# Patient Record
Sex: Female | Born: 1955 | Race: Black or African American | Hispanic: No | Smoking: Never smoker
Health system: Southern US, Community
[De-identification: ages and names within clinical notes are randomized; demographics above are authoritative.]

## PROBLEM LIST (undated history)

## (undated) DIAGNOSIS — E119 Type 2 diabetes mellitus without complications: Secondary | ICD-10-CM

## (undated) DIAGNOSIS — E079 Disorder of thyroid, unspecified: Secondary | ICD-10-CM

## (undated) DIAGNOSIS — E0789 Other specified disorders of thyroid: Secondary | ICD-10-CM

## (undated) DIAGNOSIS — K219 Gastro-esophageal reflux disease without esophagitis: Secondary | ICD-10-CM

## (undated) HISTORY — DX: Disorder of thyroid, unspecified: E07.9

## (undated) HISTORY — PX: ABDOMINAL HYSTERECTOMY: SHX81

## (undated) HISTORY — PX: KELOID EXCISION: SHX1856

## (undated) HISTORY — PX: COLONOSCOPY: SHX174

---
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
## 2000-08-21 ENCOUNTER — Other Ambulatory Visit: Admission: RE | Admit: 2000-08-21 | Discharge: 2000-08-21 | Payer: Self-pay | Admitting: Family Medicine

## 2000-08-28 ENCOUNTER — Ambulatory Visit (HOSPITAL_COMMUNITY): Admission: RE | Admit: 2000-08-28 | Discharge: 2000-08-28 | Payer: Self-pay | Admitting: Family Medicine

## 2000-09-14 ENCOUNTER — Encounter: Admission: RE | Admit: 2000-09-14 | Discharge: 2000-09-14 | Payer: Self-pay | Admitting: Family Medicine

## 2000-09-14 ENCOUNTER — Encounter: Payer: Self-pay | Admitting: Family Medicine

## 2000-11-25 ENCOUNTER — Emergency Department (HOSPITAL_COMMUNITY): Admission: EM | Admit: 2000-11-25 | Discharge: 2000-11-25 | Payer: Self-pay | Admitting: Emergency Medicine

## 2002-05-20 ENCOUNTER — Encounter: Admission: RE | Admit: 2002-05-20 | Discharge: 2002-05-20 | Payer: Self-pay | Admitting: Family Medicine

## 2002-05-20 ENCOUNTER — Encounter: Payer: Self-pay | Admitting: Family Medicine

## 2004-08-12 ENCOUNTER — Encounter: Admission: RE | Admit: 2004-08-12 | Discharge: 2004-08-12 | Payer: Self-pay | Admitting: Occupational Medicine

## 2005-02-14 ENCOUNTER — Encounter: Admission: RE | Admit: 2005-02-14 | Discharge: 2005-05-15 | Payer: Self-pay | Admitting: Occupational Medicine

## 2008-11-12 ENCOUNTER — Encounter: Admission: RE | Admit: 2008-11-12 | Discharge: 2008-11-12 | Payer: Self-pay | Admitting: Internal Medicine

## 2012-05-29 ENCOUNTER — Other Ambulatory Visit: Payer: Self-pay | Admitting: Internal Medicine

## 2012-05-29 DIAGNOSIS — Z1231 Encounter for screening mammogram for malignant neoplasm of breast: Secondary | ICD-10-CM

## 2012-06-10 ENCOUNTER — Ambulatory Visit
Admission: RE | Admit: 2012-06-10 | Discharge: 2012-06-10 | Disposition: A | Discharge status: Home / Self Care | Payer: BC Managed Care – PPO | Source: Ambulatory Visit | Attending: Internal Medicine | Admitting: Internal Medicine

## 2012-06-10 DIAGNOSIS — Z1231 Encounter for screening mammogram for malignant neoplasm of breast: Secondary | ICD-10-CM

## 2013-07-11 ENCOUNTER — Other Ambulatory Visit: Payer: Self-pay

## 2013-07-11 DIAGNOSIS — N644 Mastodynia: Secondary | ICD-10-CM

## 2013-07-21 ENCOUNTER — Ambulatory Visit
Admission: RE | Admit: 2013-07-21 | Discharge: 2013-07-21 | Disposition: A | Discharge status: Home / Self Care | Payer: BC Managed Care – PPO | Source: Ambulatory Visit

## 2013-07-21 ENCOUNTER — Other Ambulatory Visit: Payer: BC Managed Care – PPO

## 2013-07-21 DIAGNOSIS — N644 Mastodynia: Secondary | ICD-10-CM

## 2014-08-26 ENCOUNTER — Other Ambulatory Visit: Payer: Self-pay

## 2014-08-26 DIAGNOSIS — Z1231 Encounter for screening mammogram for malignant neoplasm of breast: Secondary | ICD-10-CM

## 2014-09-11 ENCOUNTER — Other Ambulatory Visit: Payer: Self-pay

## 2014-09-11 ENCOUNTER — Ambulatory Visit: Payer: Self-pay

## 2014-09-11 ENCOUNTER — Ambulatory Visit
Admission: RE | Admit: 2014-09-11 | Discharge: 2014-09-11 | Disposition: A | Discharge status: Home / Self Care | Payer: 59 | Source: Ambulatory Visit

## 2014-09-11 DIAGNOSIS — Z1231 Encounter for screening mammogram for malignant neoplasm of breast: Secondary | ICD-10-CM

## 2015-11-29 ENCOUNTER — Other Ambulatory Visit: Payer: Self-pay | Admitting: Nurse Practitioner

## 2015-11-29 DIAGNOSIS — Z1231 Encounter for screening mammogram for malignant neoplasm of breast: Secondary | ICD-10-CM

## 2015-12-15 ENCOUNTER — Ambulatory Visit
Admission: RE | Admit: 2015-12-15 | Discharge: 2015-12-15 | Disposition: A | Discharge status: Home / Self Care | Payer: 59 | Source: Ambulatory Visit | Attending: Internal Medicine | Admitting: Internal Medicine

## 2015-12-15 DIAGNOSIS — Z1231 Encounter for screening mammogram for malignant neoplasm of breast: Secondary | ICD-10-CM

## 2017-01-29 ENCOUNTER — Other Ambulatory Visit: Payer: Self-pay | Admitting: Nurse Practitioner

## 2017-01-29 DIAGNOSIS — R229 Localized swelling, mass and lump, unspecified: Secondary | ICD-10-CM

## 2017-02-02 ENCOUNTER — Other Ambulatory Visit: Payer: Self-pay | Admitting: Nurse Practitioner

## 2017-02-02 DIAGNOSIS — Z1231 Encounter for screening mammogram for malignant neoplasm of breast: Secondary | ICD-10-CM

## 2017-02-12 ENCOUNTER — Ambulatory Visit
Admission: RE | Admit: 2017-02-12 | Discharge: 2017-02-12 | Disposition: A | Discharge status: Home / Self Care | Payer: No Typology Code available for payment source | Source: Ambulatory Visit | Attending: Nurse Practitioner | Admitting: Nurse Practitioner

## 2017-02-12 DIAGNOSIS — R229 Localized swelling, mass and lump, unspecified: Secondary | ICD-10-CM

## 2017-02-21 ENCOUNTER — Ambulatory Visit
Admission: RE | Admit: 2017-02-21 | Discharge: 2017-02-21 | Disposition: A | Discharge status: Home / Self Care | Payer: No Typology Code available for payment source | Source: Ambulatory Visit | Attending: Nurse Practitioner | Admitting: Nurse Practitioner

## 2017-02-21 DIAGNOSIS — Z1231 Encounter for screening mammogram for malignant neoplasm of breast: Secondary | ICD-10-CM

## 2017-06-22 NOTE — H&P (Signed)
  HPI:   Lori Mcdaniel is a 62 y.o. female who presents as a consult Patient.   Referring Provider: Leilani Able*  Chief complaint: Thyroid mass.  HPI: About 6 months ago she noticed some swelling in the anterior neck. She was found to have a thyroid nodule enlargement. Ultrasound recently revealed an almost 6 cm right thyroid mass and a smaller one on the left. She has no trouble swallowing or breathing. No family history of thyroid disorders.  PMH/Meds/All/SocHx/FamHx/ROS:   Past Medical History:  Diagnosis Date  . Diabetes mellitus (Plain)   Past Surgical History:  Procedure Laterality Date  . BREAST BIOPSY  . CESAREAN SECTION  . CESAREAN SECTION  . CYST REMOVAL  . HYSTERECTOMY  . PARTIAL HYSTERECTOMY   No family history of bleeding disorders, wound healing problems or difficulty with anesthesia.   Social History   Social History  . Marital status: Married  Spouse name: N/A  . Number of children: N/A  . Years of education: N/A   Occupational History  . Not on file.   Social History Main Topics  . Smoking status: Never Smoker  . Smokeless tobacco: Never Used  . Alcohol use No  . Drug use: Unknown  . Sexual activity: Not on file   Other Topics Concern  . Not on file   Social History Narrative  . No narrative on file   Current Outpatient Prescriptions:  . atorvastatin (LIPITOR) 10 MG tablet, , Disp: , Rfl:  . ergocalciferol (VITAMIN D2) 50,000 unit capsule, , Disp: , Rfl:   A complete ROS was performed with pertinent positives/negatives noted in the HPI. The remainder of the ROS are negative.   Physical Exam:   Ht 1.651 m (5\' 5" )  Wt 81.2 kg (179 lb)  BMI 29.79 kg/m   General: Healthy and alert, in no distress, breathing easily. Normal affect. In a pleasant mood. Head: Normocephalic, atraumatic. No masses, or scars. Eyes: Pupils are equal, and reactive to light. Vision is grossly intact. No spontaneous or gaze nystagmus. Ears: Ear canals are  clear. Tympanic membranes are intact, with normal landmarks and the middle ears are clear and healthy. Hearing: Grossly normal. Nose: Nasal cavities are clear with healthy mucosa, no polyps or exudate.Airways are patent. Face: No masses or scars, facial nerve function is symmetric. Oral Cavity: No mucosal abnormalities are noted. Tongue with normal mobility. Dentition appears healthy. Oropharynx: Tonsils are symmetric. There are no mucosal masses identified. Tongue base appears normal and healthy. Larynx/Hypopharynx: indirect exam reveals healthy, mobile vocal cords, without mucosal lesions in the hypopharynx or larynx. Chest: Deferred Neck: Large soft mobile right thyroid mass, smaller nodule palpable on the left. No other adenopathy palpable. Neuro: Cranial nerves II-XII will normal function. Balance: Normal gate. Other findings: none.  Independent Review of Additional Tests or Records:  none  Procedures:  none  Impression & Plans:  Very large right thyroid mass. 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 We discussed options including fine-needle aspiration biopsy versus simply proceeding to thyroid lobectomy. Given the size, I am inclined to recommend surgery regardless of what the pathology is. She wants to think about this and will let us know as soon as she decides. Risks and benefits of surgery were discussed in detail. All questions were answered. A handout was provided.

## 2017-06-26 ENCOUNTER — Other Ambulatory Visit: Payer: Self-pay

## 2017-06-26 ENCOUNTER — Encounter (HOSPITAL_COMMUNITY): Payer: Self-pay | Admitting: *Deleted

## 2017-06-26 NOTE — Progress Notes (Signed)
Anesthesia Chart Review: SAME DAY WORK-UP.  Patient is a 62 year old female scheduled for right thyroidectomy, frozen section, possible total on 06/27/17 by Dr. Izora Gala. Last year patient noted swelling in the anterior neck and was found to have a ~ 6 cm right thyroid mass and a smaller one on the left. She was referred to Dr. Constance Holster in 04/2017. He thought she likely had a benign goiter, however biopsy versus surgery was recommended for definitive diagnosis. He was "inclinded to recommend surgery regardless of what the pathology is." She ultimately decided to proceed with surgery.    History includes never smoker, DM2, GERD, 5.5 cm right thyroid mass.    Meds include Lipitor, metformin.  PCP is Dr. Glendale Chard at [facility]). She reported a visit about a month ago for chest symptoms and was told it was not cardiac. Records received just after 12 PM today and visit was from 04/11/17. According to the office note, she saw [name], FNP-BC for dizziness for X 1 month but associated with intermittent left chest discomfort and left jaw pain. EKG showed NSR. Dizziness felt related to thyroid or allergic rhinitis. She was referred to ENT ASAP for finding large thyroid nodule on 01/2017 ultrasound.   I called before 1 PM and left a voice message for patient to call me. I wanted to discuss her December symptoms further. Patient had not called by by 2 PM, so I discussed known information with anesthesiologist Dr. Belinda Block with plan to further evaluate patient on the day of surgery. However, patient called me around 4 PM this afternoon. Per her account she works at a [employer], usually standing in front of a machine but does require a bit of walking as well. Shifts can be up to 12-16 hours. She continues to work. In late 03/2017, she developed a few days of feeling dizzy at work with headaches and feeling like she had to take deep breaths. She did not describe it as chest  pain. She primarily noted symptoms at work. In regards to jaw pain she said her jaw would feel sore when opening her mouth, but no jaw pain with her jaw in a relaxed position. She had had two front teeth removed just days before her jaw pain. Around the time of her symptoms there was a hole in the wall at work and her co-worker also complained of similar symptoms, so there was question of whether symptoms were related to some sort of fume inhalation or environmental exposure. The wall has since been repaired. Her symptoms resolved within a few days. She lives on the second floor and has to climb 16-18 stairs on a regular basis, she continues to work, and goes bowling twice a week. She does not get CV symptoms with these activities. At one point she was on a metformin combination medication and developed hypoglycemia and palpitations, but these symptoms resoloved once she was transitioned back to metformin. She denied known CAD, CHF, arrhythmias. She denied edema. Her mother had some sort of heart procedure when patient was a child, but does not recall the specifics but did not think it was a stent.   EKG 04/11/17 ([facility]): NSR.  Ultrasound soft tissue head/neck 02/12/17: IMPRESSION: 1. The 5.5 cm TI-RADS category 4 nodule (#2) occupying the majority of the right gland meets criteria for consideration of fine-needle aspiration biopsy. 2. The 1.8 cm TI-RADS category 3 nodule (#1) within the superior aspect of the isthmus meets criteria for  follow-up ultrasound in 1 year. The above is in keeping with the ACR TI-RADS recommendations - J Am Coll Radiol 2017;14:587-595.  She is a same day work-up so labs and CXR will need to be done on the day of surgery. A1c on 01/24/17 was 5.6 ([facility]).  Patient had a few day history of dizziness, headaches, feeling like she had to take deep breaths back in 03/2017. A co-worker had similar symptoms, so thought possibly related to environmental exposure at work. Symptoms  resolved within a few days. Jaw pain was only with opening mouth and occurred just days after teeth removed. Patient has not had recurrence of symptoms and continues to stay active at work, climbs a flight of stairs on a regular basis and bowls twice a week. Her EKG was unremarkable. Based on currently available information I would anticipate that she can proceed as planned. Anesthesiologist to evaluate on the day of surgery.   [facility] Short Stay Center/Anesthesiology Phone [phone] 06/26/2017 4:19 PM

## 2017-06-26 NOTE — Progress Notes (Signed)
Spoke with pt for pre-op call. Pt denies any cardiac history. Pt states she had some chest pressure about a month ago and saw Dr. Glendale Chard. She states she had an EKG done and the Dr. Baird Cancer did not think it was heart related. She states when she went back to work, one of her coworkers had the same symptoms and she thinks it was due to something that they were breathing in. I have requested the EKG and last office visit note. Pt is a type 2 diabetic. States her last A1C was 5.2 last week. Pt states her fasting blood sugar is usually between 90-105. Instructed pt to check her blood sugar when she gets up in the AM and every 2 hours until she leaves for the hospital. If blood sugar is 70 or below, treat with 1/2 cup of clear juice (apple or cranberry) and recheck blood sugar 15 minutes after drinking juice. If blood sugar continues to be 70 or below, call the Short Stay department and ask to speak to a nurse. Pt voiced understanding.

## 2017-06-27 ENCOUNTER — Other Ambulatory Visit: Payer: Self-pay

## 2017-06-27 ENCOUNTER — Encounter (HOSPITAL_COMMUNITY)
Admission: RE | Disposition: A | Discharge status: Home / Self Care | Payer: Self-pay | Source: Ambulatory Visit | Attending: Otolaryngology

## 2017-06-27 ENCOUNTER — Ambulatory Visit (HOSPITAL_COMMUNITY): Payer: PRIVATE HEALTH INSURANCE | Admitting: Vascular Surgery

## 2017-06-27 ENCOUNTER — Observation Stay (HOSPITAL_COMMUNITY)
Admission: RE | Admit: 2017-06-27 | Discharge: 2017-06-28 | Disposition: A | Discharge status: Home / Self Care | Payer: PRIVATE HEALTH INSURANCE | Source: Ambulatory Visit | Attending: Otolaryngology | Admitting: Otolaryngology

## 2017-06-27 ENCOUNTER — Ambulatory Visit (HOSPITAL_COMMUNITY): Payer: PRIVATE HEALTH INSURANCE

## 2017-06-27 ENCOUNTER — Encounter (HOSPITAL_COMMUNITY): Payer: Self-pay | Admitting: *Deleted

## 2017-06-27 DIAGNOSIS — E079 Disorder of thyroid, unspecified: Secondary | ICD-10-CM | POA: Diagnosis not present

## 2017-06-27 DIAGNOSIS — E119 Type 2 diabetes mellitus without complications: Secondary | ICD-10-CM | POA: Insufficient documentation

## 2017-06-27 DIAGNOSIS — D34 Benign neoplasm of thyroid gland: Secondary | ICD-10-CM | POA: Diagnosis not present

## 2017-06-27 DIAGNOSIS — E890 Postprocedural hypothyroidism: Secondary | ICD-10-CM

## 2017-06-27 DIAGNOSIS — Z7984 Long term (current) use of oral hypoglycemic drugs: Secondary | ICD-10-CM | POA: Insufficient documentation

## 2017-06-27 DIAGNOSIS — K219 Gastro-esophageal reflux disease without esophagitis: Secondary | ICD-10-CM | POA: Diagnosis not present

## 2017-06-27 DIAGNOSIS — Z9889 Other specified postprocedural states: Secondary | ICD-10-CM

## 2017-06-27 HISTORY — DX: Type 2 diabetes mellitus without complications: E11.9

## 2017-06-27 HISTORY — DX: Gastro-esophageal reflux disease without esophagitis: K21.9

## 2017-06-27 HISTORY — DX: Other specified disorders of thyroid: E07.89

## 2017-06-27 HISTORY — PX: THYROIDECTOMY: SHX17

## 2017-06-27 LAB — GLUCOSE, CAPILLARY
GLUCOSE-CAPILLARY: 176 mg/dL — AB (ref 65–99)
GLUCOSE-CAPILLARY: 159 mg/dL — AB (ref 65–99)
Glucose-Capillary: 84 mg/dL (ref 65–99)
Glucose-Capillary: 121 mg/dL — ABNORMAL HIGH (ref 65–99)
Glucose-Capillary: 148 mg/dL — ABNORMAL HIGH (ref 65–99)

## 2017-06-27 LAB — CBC
HCT: 38.5 % (ref 36.0–46.0)
Hemoglobin: 12.6 g/dL (ref 12.0–15.0)
MCH: 29 pg (ref 26.0–34.0)
MCHC: 32.7 g/dL (ref 30.0–36.0)
MCV: 88.5 fL (ref 78.0–100.0)
Platelets: 236 10*3/uL (ref 150–400)
RBC: 4.35 MIL/uL (ref 3.87–5.11)
RDW: 13.7 % (ref 11.5–15.5)
WBC: 5.2 10*3/uL (ref 4.0–10.5)

## 2017-06-27 LAB — BASIC METABOLIC PANEL
ANION GAP: 10 (ref 5–15)
BUN: 15 mg/dL (ref 6–20)
CALCIUM: 9 mg/dL (ref 8.9–10.3)
CHLORIDE: 105 mmol/L (ref 101–111)
CO2: 25 mmol/L (ref 22–32)
Creatinine, Ser: 0.79 mg/dL (ref 0.44–1.00)
GFR calc non Af Amer: >60 mL/min (ref >60)
Glucose, Bld: 112 mg/dL — ABNORMAL HIGH (ref 65–99)
Potassium: 3.8 mmol/L (ref 3.5–5.1)
Sodium: 140 mmol/L (ref 135–145)

## 2017-06-27 LAB — HEMOGLOBIN A1C
HEMOGLOBIN A1C: 5.5 % (ref 4.8–5.6)
MEAN PLASMA GLUCOSE: 111.15 mg/dL

## 2017-06-27 SURGERY — THYROIDECTOMY
Anesthesia: General | Site: Neck | Laterality: Right

## 2017-06-27 MED ORDER — ROCURONIUM BROMIDE 100 MG/10ML IV SOLN
INTRAVENOUS | Status: DC | PRN
  Administered 2017-06-27: 50 mg via INTRAVENOUS

## 2017-06-27 MED ORDER — CEFAZOLIN SODIUM-DEXTROSE 2-4 GM/100ML-% IV SOLN
INTRAVENOUS | Status: AC
  Filled 2017-06-27: qty 100

## 2017-06-27 MED ORDER — PROMETHAZINE HCL 25 MG PO TABS
25.0000 mg | ORAL_TABLET | Freq: Four times a day (QID) | ORAL | Status: DC | PRN
  Administered 2017-06-27: 25 mg via ORAL
  Filled 2017-06-27: qty 1

## 2017-06-27 MED ORDER — 0.9 % SODIUM CHLORIDE (POUR BTL) OPTIME
TOPICAL | Status: DC | PRN
  Administered 2017-06-27: 1000 mL

## 2017-06-27 MED ORDER — POTASSIUM CHLORIDE 2 MEQ/ML IV SOLN
INTRAVENOUS | Status: DC
  Administered 2017-06-27: 13:00:00 via INTRAVENOUS
  Filled 2017-06-27 (×3): qty 1000

## 2017-06-27 MED ORDER — CEFAZOLIN SODIUM-DEXTROSE 2-3 GM-%(50ML) IV SOLR
INTRAVENOUS | Status: DC | PRN
  Administered 2017-06-27: 2 g via INTRAVENOUS

## 2017-06-27 MED ORDER — LIDOCAINE 2% (20 MG/ML) 5 ML SYRINGE
INTRAMUSCULAR | Status: AC
  Filled 2017-06-27: qty 5

## 2017-06-27 MED ORDER — HYDROMORPHONE HCL 1 MG/ML IJ SOLN: 0.2500 mg | INTRAMUSCULAR | Status: DC | PRN

## 2017-06-27 MED ORDER — MIDAZOLAM HCL 2 MG/2ML IJ SOLN: 0.5000 mg | Freq: Once | INTRAMUSCULAR | Status: DC | PRN

## 2017-06-27 MED ORDER — PROPOFOL 10 MG/ML IV BOLUS
INTRAVENOUS | Status: AC
  Filled 2017-06-27: qty 20

## 2017-06-27 MED ORDER — STERILE WATER FOR IRRIGATION IR SOLN
Status: DC | PRN
  Administered 2017-06-27: 1000 mL

## 2017-06-27 MED ORDER — FENTANYL CITRATE (PF) 100 MCG/2ML IJ SOLN
INTRAMUSCULAR | Status: DC | PRN
  Administered 2017-06-27: 100 ug via INTRAVENOUS
  Administered 2017-06-27 (×2): 50 ug via INTRAVENOUS

## 2017-06-27 MED ORDER — INSULIN ASPART 100 UNIT/ML ~~LOC~~ SOLN
0.0000 [IU] | Freq: Three times a day (TID) | SUBCUTANEOUS | Status: DC
  Administered 2017-06-27: 2 [IU] via SUBCUTANEOUS
  Administered 2017-06-27: 3 [IU] via SUBCUTANEOUS
  Administered 2017-06-28: 2 [IU] via SUBCUTANEOUS

## 2017-06-27 MED ORDER — SUGAMMADEX SODIUM 200 MG/2ML IV SOLN
INTRAVENOUS | Status: DC | PRN
  Administered 2017-06-27: 170 mg via INTRAVENOUS

## 2017-06-27 MED ORDER — IBUPROFEN 100 MG/5ML PO SUSP
400.0000 mg | Freq: Four times a day (QID) | ORAL | Status: DC | PRN
  Administered 2017-06-27 – 2017-06-28 (×2): 400 mg via ORAL
  Filled 2017-06-27 (×3): qty 20

## 2017-06-27 MED ORDER — PROMETHAZINE HCL 25 MG RE SUPP: 25.0000 mg | Freq: Four times a day (QID) | RECTAL | 1 refills | Status: DC | PRN

## 2017-06-27 MED ORDER — DEXAMETHASONE SODIUM PHOSPHATE 10 MG/ML IJ SOLN
INTRAMUSCULAR | Status: DC | PRN
  Administered 2017-06-27: 10 mg via INTRAVENOUS

## 2017-06-27 MED ORDER — SUGAMMADEX SODIUM 200 MG/2ML IV SOLN
INTRAVENOUS | Status: AC
  Filled 2017-06-27: qty 2

## 2017-06-27 MED ORDER — FENTANYL CITRATE (PF) 250 MCG/5ML IJ SOLN
INTRAMUSCULAR | Status: AC
  Filled 2017-06-27: qty 5

## 2017-06-27 MED ORDER — MEPERIDINE HCL 50 MG/ML IJ SOLN: 6.2500 mg | INTRAMUSCULAR | Status: DC | PRN

## 2017-06-27 MED ORDER — ROCURONIUM BROMIDE 50 MG/5ML IV SOLN
INTRAVENOUS | Status: AC
  Filled 2017-06-27: qty 1

## 2017-06-27 MED ORDER — PNEUMOCOCCAL VAC POLYVALENT 25 MCG/0.5ML IJ INJ: 0.5000 mL | INJECTION | INTRAMUSCULAR | Status: DC

## 2017-06-27 MED ORDER — LIDOCAINE HCL (CARDIAC) 20 MG/ML IV SOLN
INTRAVENOUS | Status: DC | PRN
  Administered 2017-06-27: 20 mg via INTRAVENOUS

## 2017-06-27 MED ORDER — ONDANSETRON HCL 4 MG/2ML IJ SOLN
INTRAMUSCULAR | Status: AC
  Filled 2017-06-27: qty 2

## 2017-06-27 MED ORDER — HYDROCODONE-ACETAMINOPHEN 5-325 MG PO TABS: 1.0000 | ORAL_TABLET | ORAL | Status: DC | PRN

## 2017-06-27 MED ORDER — HYDROCODONE-ACETAMINOPHEN 7.5-325 MG PO TABS: 1.0000 | ORAL_TABLET | Freq: Four times a day (QID) | ORAL | 0 refills | Status: DC | PRN

## 2017-06-27 MED ORDER — ONDANSETRON HCL 4 MG/2ML IJ SOLN
INTRAMUSCULAR | Status: DC | PRN
  Administered 2017-06-27: 4 mg via INTRAVENOUS

## 2017-06-27 MED ORDER — MIDAZOLAM HCL 5 MG/5ML IJ SOLN
INTRAMUSCULAR | Status: DC | PRN
  Administered 2017-06-27: 2 mg via INTRAVENOUS

## 2017-06-27 MED ORDER — DEXAMETHASONE SODIUM PHOSPHATE 10 MG/ML IJ SOLN
INTRAMUSCULAR | Status: AC
  Filled 2017-06-27: qty 1

## 2017-06-27 MED ORDER — ATORVASTATIN CALCIUM 10 MG PO TABS
10.0000 mg | ORAL_TABLET | Freq: Every evening | ORAL | Status: DC
  Administered 2017-06-27: 10 mg via ORAL
  Filled 2017-06-27: qty 1

## 2017-06-27 MED ORDER — PROMETHAZINE HCL 25 MG RE SUPP
25.0000 mg | Freq: Four times a day (QID) | RECTAL | Status: DC | PRN
  Filled 2017-06-27: qty 1

## 2017-06-27 MED ORDER — LACTATED RINGERS IV SOLN
INTRAVENOUS | Status: DC
  Administered 2017-06-27 (×2): via INTRAVENOUS

## 2017-06-27 MED ORDER — ROCURONIUM BROMIDE 50 MG/5ML IV SOLN
INTRAVENOUS | Status: AC
Start: 2017-06-27 — End: ?
  Filled 2017-06-27: qty 1

## 2017-06-27 MED ORDER — METFORMIN HCL 500 MG PO TABS
500.0000 mg | ORAL_TABLET | Freq: Every day | ORAL | Status: DC
  Administered 2017-06-28: 500 mg via ORAL
  Filled 2017-06-27: qty 1

## 2017-06-27 MED ORDER — MIDAZOLAM HCL 2 MG/2ML IJ SOLN
INTRAMUSCULAR | Status: AC
  Filled 2017-06-27: qty 2

## 2017-06-27 MED ORDER — PROMETHAZINE HCL 25 MG/ML IJ SOLN: 6.2500 mg | INTRAMUSCULAR | Status: DC | PRN

## 2017-06-27 MED ORDER — PROPOFOL 10 MG/ML IV BOLUS
INTRAVENOUS | Status: DC | PRN
  Administered 2017-06-27: 120 mg via INTRAVENOUS

## 2017-06-27 SURGICAL SUPPLY — 38 items
ADH SKN CLS APL DERMABOND .7 (GAUZE/BANDAGES/DRESSINGS) ×1
BLADE SURG 15 STRL LF DISP TIS (BLADE) IMPLANT
BLADE SURG 15 STRL SS (BLADE) ×2
CANISTER SUCT 3000ML PPV (MISCELLANEOUS) ×2 IMPLANT
CLEANER TIP ELECTROSURG 2X2 (MISCELLANEOUS) ×2 IMPLANT
CORDS BIPOLAR (ELECTRODE) ×2 IMPLANT
COVER SURGICAL LIGHT HANDLE (MISCELLANEOUS) ×2 IMPLANT
DERMABOND ADVANCED (GAUZE/BANDAGES/DRESSINGS) ×1
DERMABOND ADVANCED .7 DNX12 (GAUZE/BANDAGES/DRESSINGS) ×1 IMPLANT
DRAIN JACKSON RD 7FR 3/32 (WOUND CARE) ×1 IMPLANT
DRAIN SNY 10 ROU (WOUND CARE) IMPLANT
DRAPE HALF SHEET 40X57 (DRAPES) IMPLANT
ELECT COATED BLADE 2.86 ST (ELECTRODE) ×2 IMPLANT
ELECT REM PT RETURN 9FT ADLT (ELECTROSURGICAL) ×2
ELECTRODE REM PT RTRN 9FT ADLT (ELECTROSURGICAL) ×1 IMPLANT
EVACUATOR SILICONE 100CC (DRAIN) ×2 IMPLANT
FORCEPS BIPOLAR SPETZLER 8 1.0 (NEUROSURGERY SUPPLIES) ×2 IMPLANT
GAUZE SPONGE 4X4 16PLY XRAY LF (GAUZE/BANDAGES/DRESSINGS) ×2 IMPLANT
GLOVE BIO SURGEON STRL SZ 6.5 (GLOVE) ×1 IMPLANT
GLOVE ECLIPSE 7.5 STRL STRAW (GLOVE) ×2 IMPLANT
GOWN STRL REUS W/ TWL LRG LVL3 (GOWN DISPOSABLE) ×2 IMPLANT
GOWN STRL REUS W/TWL LRG LVL3 (GOWN DISPOSABLE) ×4
KIT BASIN OR (CUSTOM PROCEDURE TRAY) ×2 IMPLANT
KIT ROOM TURNOVER OR (KITS) ×2 IMPLANT
NDL PRECISIONGLIDE 27X1.5 (NEEDLE) ×1 IMPLANT
NEEDLE PRECISIONGLIDE 27X1.5 (NEEDLE) IMPLANT
NS IRRIG 1000ML POUR BTL (IV SOLUTION) ×2 IMPLANT
PAD ARMBOARD 7.5X6 YLW CONV (MISCELLANEOUS) ×4 IMPLANT
PENCIL FOOT CONTROL (ELECTRODE) ×2 IMPLANT
SHEARS HARMONIC 9CM CVD (BLADE) ×2 IMPLANT
STAPLER VISISTAT 35W (STAPLE) ×2 IMPLANT
SUT CHROMIC 3 0 PS 2 (SUTURE) ×2 IMPLANT
SUT CHROMIC 4 0 PS 2 18 (SUTURE) IMPLANT
SUT ETHILON 3 0 PS 1 (SUTURE) ×2 IMPLANT
SUT SILK 3 0 REEL (SUTURE) IMPLANT
SUT SILK 4 0 REEL (SUTURE) ×2 IMPLANT
TOWEL OR 17X24 6PK STRL BLUE (TOWEL DISPOSABLE) ×2 IMPLANT
TRAY ENT MC OR (CUSTOM PROCEDURE TRAY) ×2 IMPLANT

## 2017-06-27 NOTE — Discharge Instructions (Signed)
You may shower and use soap and water. Do not use any creams, oils or ointment. ° °

## 2017-06-27 NOTE — Anesthesia Procedure Notes (Signed)
Procedure Name: Intubation Date/Time: 06/27/2017 9:41 AM Performed by: Lance Coon, CRNA Pre-anesthesia Checklist: Patient identified, Emergency Drugs available, Suction available, Patient being monitored and Timeout performed Patient Re-evaluated:Patient Re-evaluated prior to induction Oxygen Delivery Method: Circle system utilized Preoxygenation: Pre-oxygenation with 100% oxygen Induction Type: IV induction Ventilation: Mask ventilation without difficulty Laryngoscope Size: Miller Grade View: Grade I Tube type: Oral Number of attempts: 1 Airway Equipment and Method: Stylet Placement Confirmation: ETT inserted through vocal cords under direct vision,  positive ETCO2 and breath sounds checked- equal and bilateral Secured at: 21 cm Tube secured with: Tape Dental Injury: Teeth and Oropharynx as per pre-operative assessment

## 2017-06-27 NOTE — Progress Notes (Signed)
ENT Post Operative Note  Subjective: +nausea, vomiting- now resolved, taking good PO No difficulty voiding Pain well controlled  Vitals:   06/27/17 1145 06/27/17 1206  BP: (!) 151/62 140/70  Pulse: 69 67  Resp: 14   Temp: (!) 97 F (36.1 C) 97.7 F (36.5 C)  SpO2: 97% 97%     OBJECTIVE  Gen: alert, cooperative, appropriate Head/ENT: EOMI, neck supple, mucus membranes moist and pink, conjunctiva clear Incisions c/d/i JP drain x 1 with small amount of sanguinous drainage Face moves symmetrically Respiratory: Voice without dysphonia. non-labored breathing, no accessory muscle use, normal HR, good O2 saturations    ASSESS/ PLAN  Lori Mcdaniel is a 62 y.o. female who is POD 0 from Right thyroid lobectomy.  -pain control -MIVF- will saline lock when tolerating PO intake -wound care, routine  Helayne Seminole, MD

## 2017-06-27 NOTE — Anesthesia Preprocedure Evaluation (Addendum)
Anesthesia Evaluation  Patient identified by MRN, date of birth, ID band Patient awake    Reviewed: Allergy & Precautions, NPO status , Patient's Chart, lab work & pertinent test results  History of Anesthesia Complications Negative for: history of anesthetic complications  Airway Mallampati: I  TM Distance: >3 FB Neck ROM: Full Positive for:  Tracheal deviation   Dental  (+) Dental Advisory Given   Pulmonary neg pulmonary ROS,    breath sounds clear to auscultation       Cardiovascular negative cardio ROS   Rhythm:Regular Rate:Normal     Neuro/Psych negative neurological ROS     GI/Hepatic Neg liver ROS, GERD  Controlled,  Endo/Other  diabetes (glu 84), Oral Hypoglycemic AgentsThyroid mass  Renal/GU negative Renal ROS     Musculoskeletal   Abdominal   Peds  Hematology negative hematology ROS (+)   Anesthesia Other Findings   Reproductive/Obstetrics                            Anesthesia Physical Anesthesia Plan  ASA: II  Anesthesia Plan: General   Post-op Pain Management:    Induction: Intravenous  PONV Risk Score and Plan: 3 and Ondansetron, Dexamethasone and Scopolamine patch - Pre-op  Airway Management Planned: Oral ETT  Additional Equipment:   Intra-op Plan:   Post-operative Plan: Extubation in OR  Informed Consent: I have reviewed the patients History and Physical, chart, labs and discussed the procedure including the risks, benefits and alternatives for the proposed anesthesia with the patient or authorized representative who has indicated his/her understanding and acceptance.   Dental advisory given  Plan Discussed with: CRNA and Surgeon  Anesthesia Plan Comments: (Plan routine monitors, GETA)        Anesthesia Quick Evaluation

## 2017-06-27 NOTE — Op Note (Signed)
OPERATIVE REPORT  DATE OF SURGERY: 06/27/2017  PATIENT:  Lori Mcdaniel,  62 y.o. female  PRE-OPERATIVE DIAGNOSIS:  Thyroid mass  POST-OPERATIVE DIAGNOSIS:  Thyroid mass  PROCEDURE:  Procedure(s): THYROIDECTOMY, right  SURGEON:  Beckie Salts, MD  ASSISTANTS: Jolene Provost PA  ANESTHESIA:   General   EBL: 50 ml  DRAINS: 7 French round JP  LOCAL MEDICATIONS USED:  None  SPECIMEN: Right thyroid lobectomy, frozen section negative for papillary carcinoma.  COUNTS:  Correct  PROCEDURE DETAILS: The patient was taken to the operating room and placed on the operating table in the supine position. A shoulder roll was placed beneath the shoulder blades and the neck was extended. The neck was prepped and draped in a standard fashion. A low collar transverse incision was outlined with a marking pen and was incised with electrocautery. Dissection was continued down through the platysma layer.  Self-retaining retractors were used throughout the case.  The midline fascia was divided.  The strap muscles were reflected laterally off of the right lobe of the thyroid which was very large but soft.  Blunt dissection was used to separate surrounding tissue from the thyroid.  There were no obvious adhesions.  During the dissection the capsule was inadvertently torn but the thyroid lobe was kept intact.  The isthmus was divided using the harmonic dissector.  Superior and inferior vasculature were separately identified and ligated using either the harmonic dissector or 4-0 silk ties.  The middle thyroid vein was divided as well and secured with 4-0 silk tie.  As the gland was brought forward there is ligament was divided using the harmonic dissector.  A putative superior parathyroid was identified and preserved with its blood supply.  The recurrent nerve was identified and seen branching and entering into the larynx and preserved.  The right lobe was removed and sent for pathologic evaluation.  Frozen  section analysis was negative for papillary carcinoma.  Hemostasis was completed using combination of 4-0 silk ties and bipolar cautery.  The drain was secured in place with a nylon suture. The midline fascia was reapproximated with interrupted chromic suture. The platysma layer was also reapproximated with interrupted chromic suture. A running subcuticular closure was accomplished. Dermabond was used on the skin. The drain was charged. The patient was awakened, extubated and transferred to recovery in stable condition.   PATIENT DISPOSITION:  To PACU, stable

## 2017-06-27 NOTE — Interval H&P Note (Signed)
History and Physical Interval Note:  06/27/2017 9:17 AM  Lori Mcdaniel  has presented today for surgery, with the diagnosis of Thryoid mass  The various methods of treatment have been discussed with the patient and family. After consideration of risks, benefits and other options for treatment, the patient has consented to  Procedure(s) with comments: THYROIDECTOMY (Right) - Right Thyroid lobectomy, frozen section possible total as a surgical intervention .  The patient's history has been reviewed, patient examined, no change in status, stable for surgery.  I have reviewed the patient's chart and labs.  Questions were answered to the patient's satisfaction.     Izora Gala

## 2017-06-27 NOTE — Transfer of Care (Signed)
Immediate Anesthesia Transfer of Care Note  Patient: Lori Mcdaniel  Procedure(s) Performed: THYROIDECTOMY (Right Neck)  Patient Location: PACU  Anesthesia Type:General  Level of Consciousness: awake and patient cooperative  Airway & Oxygen Therapy: Patient Spontanous Breathing  Post-op Assessment: Report given to RN and Post -op Vital signs reviewed and stable  Post vital signs: Reviewed and stable  Last Vitals:  Vitals:   06/27/17 0658 06/27/17 1114  BP: (!) 150/69 (!) 156/69  Pulse: 61 75  Resp: 20 11  Temp: 36.8 C (!) 36.1 C  SpO2: 98% 97%    Last Pain:  Vitals:   06/27/17 1114  TempSrc:   PainSc: (P) Asleep      Patients Stated Pain Goal: 2 (70/17/79 3903)  Complications: No apparent anesthesia complications

## 2017-06-27 NOTE — Anesthesia Postprocedure Evaluation (Signed)
Anesthesia Post Note  Patient: Lori Mcdaniel  Procedure(s) Performed: THYROIDECTOMY (Right Neck)     Patient location during evaluation: PACU Anesthesia Type: General Level of consciousness: awake and alert, oriented and patient cooperative Pain management: pain level controlled Vital Signs Assessment: post-procedure vital signs reviewed and stable Respiratory status: spontaneous breathing, nonlabored ventilation and respiratory function stable Cardiovascular status: blood pressure returned to baseline and stable Postop Assessment: no apparent nausea or vomiting Anesthetic complications: no    Last Vitals:  Vitals:   06/27/17 1145 06/27/17 1206  BP: (!) 151/62 140/70  Pulse: 69 67  Resp: 14   Temp: (!) 36.1 C 36.5 C  SpO2: 97% 97%    Last Pain:  Vitals:   06/27/17 1206  TempSrc: Oral  PainSc:                  Sherrell Farish,E. Caydn Justen

## 2017-06-28 ENCOUNTER — Encounter (HOSPITAL_COMMUNITY): Payer: Self-pay | Admitting: Otolaryngology

## 2017-06-28 DIAGNOSIS — E079 Disorder of thyroid, unspecified: Secondary | ICD-10-CM | POA: Diagnosis not present

## 2017-06-28 LAB — GLUCOSE, CAPILLARY: GLUCOSE-CAPILLARY: 122 mg/dL — AB (ref 65–99)

## 2017-06-28 NOTE — Progress Notes (Signed)
Discharged home today, son's girlfriend driving her home. Personal belongings, Discharged instructions, medications prescription given to patient. Verbalized understanding of instructions. No further questons asked

## 2017-06-28 NOTE — Discharge Summary (Signed)
Physician Discharge Summary  Patient ID: CALIEGH MIDDLEKAUFF MRN: [id number] DOB/AGE: June 26, 1955 62 y.o.  Admit date: 06/27/2017 Discharge date: 06/28/2017  Admission Diagnoses:thyroid mass  Discharge Diagnoses:  Active Problems:   S/P partial thyroidectomy   Discharged Condition: good  Hospital Course: no complications  Consults: none  Significant Diagnostic Studies: none  Treatments: surgery: thyroid lobectomy  Discharge Exam: Blood pressure (!) 143/62, pulse 81, temperature 99 F (37.2 C), temperature source Oral, resp. rate 17, height 5\' 7"  (1.702 m), weight 83 kg (183 lb), SpO2 100 %. PHYSICAL EXAM: Incision excellent, JP removed. Voice normal. No swelling.  Disposition: Final discharge disposition not confirmed  Discharge Instructions    Diet - low sodium heart healthy   Complete by:  As directed    Increase activity slowly   Complete by:  As directed      Allergies as of 06/28/2017      Reactions   Penicillins    UNSPECIFIED REACTION  "Pt has had med in last 10 yrs - was ok to take" Has patient had a PCN reaction causing immediate rash, facial/tongue/throat swelling, SOB or lightheadedness with hypotension: no Has patient had a PCN reaction causing severe rash involving mucus membranes or skin necrosis: no Has patient had a PCN reaction that required hospitalization: no Has patient had a PCN reaction occurring within the last 10 years: no If all of the above answers are "NO", then may proceed with Cephalos      Medication List    TAKE these medications   atorvastatin 10 MG tablet Commonly known as:  LIPITOR Take 1 tablet by mouth every evening.   HYDROcodone-acetaminophen 7.5-325 MG tablet Commonly known as:  NORCO Take 1 tablet by mouth every 6 (six) hours as needed for moderate pain.   metFORMIN 500 MG tablet Commonly known as:  GLUCOPHAGE Take 1 tablet by mouth every morning.   promethazine 25 MG suppository Commonly known as:  PHENERGAN Place 1  suppository (25 mg total) rectally every 6 (six) hours as needed for nausea or vomiting.      Follow-up Information    Izora Gala, MD. Schedule an appointment as soon as possible for a visit in 1 week.   Specialty:  Otolaryngology Contact information: [address] [place] [address] [phone]           Signed: Izora Gala 06/28/2017, 8:34 AM

## 2017-07-26 LAB — HEMOGLOBIN A1C: HEMOGLOBIN A1C: 5.9 (ref 4.0–6.0)

## 2017-07-26 LAB — CBC AND DIFFERENTIAL
HCT: 39 (ref 36–46)
Hemoglobin: 12.7 (ref 12.0–16.0)
Platelets: 226 (ref 150–399)
WBC: 4.8

## 2017-07-26 LAB — LIPID PANEL
Cholesterol: 168 (ref 0–200)
HDL: 70 (ref 35–70)
LDL Cholesterol: 86
LDL/HDL RATIO: 1.2
TRIGLYCERIDES: 61 (ref 40–160)

## 2017-07-26 LAB — BASIC METABOLIC PANEL
BUN: 16 (ref 4–21)
CREATININE: 0.8 (ref 0.5–1.1)
Glucose: 98
Potassium: 4.3 (ref 3.4–5.3)
SODIUM: 141 (ref 137–147)

## 2017-07-26 LAB — TSH: TSH: 1.69 (ref 0.41–5.90)

## 2017-07-26 LAB — HEPATIC FUNCTION PANEL
ALT: 18 (ref 7–35)
AST: 18 (ref 13–35)
Alkaline Phosphatase: 92 (ref 25–125)

## 2018-01-19 ENCOUNTER — Encounter: Payer: Self-pay | Admitting: Nurse Practitioner

## 2018-01-30 ENCOUNTER — Encounter: Payer: Self-pay | Admitting: Nurse Practitioner

## 2018-01-30 ENCOUNTER — Ambulatory Visit: Payer: PRIVATE HEALTH INSURANCE | Admitting: Nurse Practitioner

## 2018-01-30 VITALS — BP 132/72 | HR 70 | Temp 98.3°F | Ht 65.0 in | Wt 194.6 lb

## 2018-01-30 DIAGNOSIS — E785 Hyperlipidemia, unspecified: Secondary | ICD-10-CM | POA: Diagnosis not present

## 2018-01-30 DIAGNOSIS — Z0000 Encounter for general adult medical examination without abnormal findings: Secondary | ICD-10-CM | POA: Diagnosis not present

## 2018-01-30 DIAGNOSIS — E041 Nontoxic single thyroid nodule: Secondary | ICD-10-CM | POA: Diagnosis not present

## 2018-01-30 DIAGNOSIS — E119 Type 2 diabetes mellitus without complications: Secondary | ICD-10-CM

## 2018-01-30 DIAGNOSIS — Z1211 Encounter for screening for malignant neoplasm of colon: Secondary | ICD-10-CM

## 2018-01-30 DIAGNOSIS — Z1159 Encounter for screening for other viral diseases: Secondary | ICD-10-CM

## 2018-01-30 LAB — POCT URINALYSIS DIPSTICK
Bilirubin, UA: NEGATIVE
Blood, UA: NEGATIVE
Glucose, UA: NEGATIVE
Ketones, UA: NEGATIVE
LEUKOCYTES UA: NEGATIVE
NITRITE UA: NEGATIVE
PH UA: 6 (ref 5.0–8.0)
PROTEIN UA: NEGATIVE
Spec Grav, UA: 1.025 (ref 1.010–1.025)
UROBILINOGEN UA: NEGATIVE U/dL — AB

## 2018-01-30 LAB — POCT UA - MICROALBUMIN
Albumin/Creatinine Ratio, Urine, POC: 30
CREATININE, POC: 200 mg/dL
MICROALBUMIN (UR) POC: 10 mg/L

## 2018-01-30 NOTE — Patient Instructions (Signed)
Health Maintenance for Postmenopausal Women Menopause is a normal process in which your reproductive ability comes to an end. This process happens gradually over a span of months to years, usually between the ages of 22 and 9. Menopause is complete when you have missed 12 consecutive menstrual periods. It is important to talk with your health care provider about some of the most common conditions that affect postmenopausal women, such as heart disease, cancer, and bone loss (osteoporosis). Adopting a healthy lifestyle and getting preventive care can help to promote your health and wellness. Those actions can also lower your chances of developing some of these common conditions. What should I know about menopause? During menopause, you may experience a number of symptoms, such as:  Moderate-to-severe hot flashes.  Night sweats.  Decrease in sex drive.  Mood swings.  Headaches.  Tiredness.  Irritability.  Memory problems.  Insomnia.  Choosing to treat or not to treat menopausal changes is an individual decision that you make with your health care provider. What should I know about hormone replacement therapy and supplements? Hormone therapy products are effective for treating symptoms that are associated with menopause, such as hot flashes and night sweats. Hormone replacement carries certain risks, especially as you become older. If you are thinking about using estrogen or estrogen with progestin treatments, discuss the benefits and risks with your health care provider. What should I know about heart disease and stroke? Heart disease, heart attack, and stroke become more likely as you age. This may be due, in part, to the hormonal changes that your body experiences during menopause. These can affect how your body processes dietary fats, triglycerides, and cholesterol. Heart attack and stroke are both medical emergencies. There are many things that you can do to help prevent heart disease  and stroke:  Have your blood pressure checked at least every 1-2 years. High blood pressure causes heart disease and increases the risk of stroke.  If you are 53-22 years old, ask your health care provider if you should take aspirin to prevent a heart attack or a stroke.  Do not use any tobacco products, including cigarettes, chewing tobacco, or electronic cigarettes. If you need help quitting, ask your health care provider.  It is important to eat a healthy diet and maintain a healthy weight. ? Be sure to include plenty of vegetables, fruits, low-fat dairy products, and lean protein. ? Avoid eating foods that are high in solid fats, added sugars, or salt (sodium).  Get regular exercise. This is one of the most important things that you can do for your health. ? Try to exercise for at least 150 minutes each week. The type of exercise that you do should increase your heart rate and make you sweat. This is known as moderate-intensity exercise. ? Try to do strengthening exercises at least twice each week. Do these in addition to the moderate-intensity exercise.  Know your numbers.Ask your health care provider to check your cholesterol and your blood glucose. Continue to have your blood tested as directed by your health care provider.  What should I know about cancer screening? There are several types of cancer. Take the following steps to reduce your risk and to catch any cancer development as early as possible. Breast Cancer  Practice breast self-awareness. ? This means understanding how your breasts normally appear and feel. ? It also means doing regular breast self-exams. Let your health care provider know about any changes, no matter how small.  If you are 40  or older, have a clinician do a breast exam (clinical breast exam or CBE) every year. Depending on your age, family history, and medical history, it may be recommended that you also have a yearly breast X-ray (mammogram).  If you  have a family history of breast cancer, talk with your health care provider about genetic screening.  If you are at high risk for breast cancer, talk with your health care provider about having an MRI and a mammogram every year.  Breast cancer (BRCA) gene test is recommended for women who have family members with BRCA-related cancers. Results of the assessment will determine the need for genetic counseling and BRCA1 and for BRCA2 testing. BRCA-related cancers include these types: ? Breast. This occurs in males or females. ? Ovarian. ? Tubal. This may also be called fallopian tube cancer. ? Cancer of the abdominal or pelvic lining (peritoneal cancer). ? Prostate. ? Pancreatic.  Cervical, Uterine, and Ovarian Cancer Your health care provider may recommend that you be screened regularly for cancer of the pelvic organs. These include your ovaries, uterus, and vagina. This screening involves a pelvic exam, which includes checking for microscopic changes to the surface of your cervix (Pap test).  For women ages 21-65, health care providers may recommend a pelvic exam and a Pap test every three years. For women ages 79-65, they may recommend the Pap test and pelvic exam, combined with testing for human papilloma virus (HPV), every five years. Some types of HPV increase your risk of cervical cancer. Testing for HPV may also be done on women of any age who have unclear Pap test results.  Other health care providers may not recommend any screening for nonpregnant women who are considered low risk for pelvic cancer and have no symptoms. Ask your health care provider if a screening pelvic exam is right for you.  If you have had past treatment for cervical cancer or a condition that could lead to cancer, you need Pap tests and screening for cancer for at least 20 years after your treatment. If Pap tests have been discontinued for you, your risk factors (such as having a new sexual partner) need to be  reassessed to determine if you should start having screenings again. Some women have medical problems that increase the chance of getting cervical cancer. In these cases, your health care provider may recommend that you have screening and Pap tests more often.  If you have a family history of uterine cancer or ovarian cancer, talk with your health care provider about genetic screening.  If you have vaginal bleeding after reaching menopause, tell your health care provider.  There are currently no reliable tests available to screen for ovarian cancer.  Lung Cancer Lung cancer screening is recommended for adults 69-62 years old who are at high risk for lung cancer because of a history of smoking. A yearly low-dose CT scan of the lungs is recommended if you:  Currently smoke.  Have a history of at least 30 pack-years of smoking and you currently smoke or have quit within the past 15 years. A pack-year is smoking an average of one pack of cigarettes per day for one year.  Yearly screening should:  Continue until it has been 15 years since you quit.  Stop if you develop a health problem that would prevent you from having lung cancer treatment.  Colorectal Cancer  This type of cancer can be detected and can often be prevented.  Routine colorectal cancer screening usually begins at  age 42 and continues through age 45.  If you have risk factors for colon cancer, your health care provider may recommend that you be screened at an earlier age.  If you have a family history of colorectal cancer, talk with your health care provider about genetic screening.  Your health care provider may also recommend using home test kits to check for hidden blood in your stool.  A small camera at the end of a tube can be used to examine your colon directly (sigmoidoscopy or colonoscopy). This is done to check for the earliest forms of colorectal cancer.  Direct examination of the colon should be repeated every  5-10 years until age 71. However, if early forms of precancerous polyps or small growths are found or if you have a family history or genetic risk for colorectal cancer, you may need to be screened more often.  Skin Cancer  Check your skin from head to toe regularly.  Monitor any moles. Be sure to tell your health care provider: ? About any new moles or changes in moles, especially if there is a change in a mole's shape or color. ? If you have a mole that is larger than the size of a pencil eraser.  If any of your family members has a history of skin cancer, especially at a young age, talk with your health care provider about genetic screening.  Always use sunscreen. Apply sunscreen liberally and repeatedly throughout the day.  Whenever you are outside, protect yourself by wearing long sleeves, pants, a wide-brimmed hat, and sunglasses.  What should I know about osteoporosis? Osteoporosis is a condition in which bone destruction happens more quickly than new bone creation. After menopause, you may be at an increased risk for osteoporosis. To help prevent osteoporosis or the bone fractures that can happen because of osteoporosis, the following is recommended:  If you are 46-71 years old, get at least 1,000 mg of calcium and at least 600 mg of vitamin D per day.  If you are older than age 55 but younger than age 65, get at least 1,200 mg of calcium and at least 600 mg of vitamin D per day.  If you are older than age 54, get at least 1,200 mg of calcium and at least 800 mg of vitamin D per day.  Smoking and excessive alcohol intake increase the risk of osteoporosis. Eat foods that are rich in calcium and vitamin D, and do weight-bearing exercises several times each week as directed by your health care provider. What should I know about how menopause affects my mental health? Depression may occur at any age, but it is more common as you become older. Common symptoms of depression  include:  Low or sad mood.  Changes in sleep patterns.  Changes in appetite or eating patterns.  Feeling an overall lack of motivation or enjoyment of activities that you previously enjoyed.  Frequent crying spells.  Talk with your health care provider if you think that you are experiencing depression. What should I know about immunizations? It is important that you get and maintain your immunizations. These include:  Tetanus, diphtheria, and pertussis (Tdap) booster vaccine.  Influenza every year before the flu season begins.  Pneumonia vaccine.  Shingles vaccine.  Your health care provider may also recommend other immunizations. This information is not intended to replace advice given to you by your health care provider. Make sure you discuss any questions you have with your health care provider. Document Released: 06/02/2005  Document Revised: 10/29/2015 Document Reviewed: 01/12/2015 Elsevier Interactive Patient Education  2018 [employer]  

## 2018-01-30 NOTE — Progress Notes (Addendum)
Subjective:     Patient ID: Lori Mcdaniel , female    DOB: 24-Mar-1956 , 62 y.o.   MRN: [id number]   Diabetes  She presents for her follow-up diabetic visit. She has type 2 diabetes mellitus.     Preventive Exam The patient states she uses none for birth control. Last LMP was No LMP recorded. Patient is postmenopausal.. Negative for Dysmenorrhea and Negative for Menorrhagia. Negative for: breast discharge, breast lump(s), breast pain and breast self exam. Associated symptoms include abnormal vaginal bleeding. Pertinent negatives include abnormal bleeding (hematology), anxiety, decreased libido, depression, difficulty falling sleep, dyspareunia, history of infertility, nocturia, sexual dysfunction, sleep disturbances, urinary incontinence, urinary urgency, vaginal discharge and vaginal itching. Diet regular.The patient states her exercise level is  rarely.      . The patient's tobacco use is:  Social History   Tobacco Use  Smoking Status Never Smoker  Smokeless Tobacco Never Used  . She has been exposed to passive smoke. The patient's alcohol use is:  Social History   Substance and Sexual Activity  Alcohol Use No  . Frequency: Never  . Additional information: Last pap 06/2015, next one scheduled for 06/2018.  Past Medical History:  Diagnosis Date  . GERD (gastroesophageal reflux disease)    years ago  . Thyroid mass of unclear etiology   . Type II diabetes mellitus (HCC)       Current Outpatient Medications:  .  atorvastatin (LIPITOR) 10 MG tablet, Take 1 tablet by mouth every evening., Disp: , Rfl: 2 .  metFORMIN (GLUCOPHAGE) 500 MG tablet, Take 1 tablet by mouth every morning., Disp: , Rfl: 0 .  nystatin (NYSTATIN) powder, Apply topically 2 (two) times daily., Disp: , Rfl:    Review of Systems  Constitutional: Negative.   HENT: Negative.   Eyes: Negative.   Respiratory: Negative.   Cardiovascular: Negative.   Gastrointestinal: Negative.   Endocrine: Negative.    Genitourinary: Negative.   Musculoskeletal: Negative.   Skin: Negative.   Allergic/Immunologic: Negative.   Neurological: Negative.   Hematological: Negative.   Psychiatric/Behavioral: Negative.      Today's Vitals   01/30/18 1522  BP: 132/72  Pulse: 70  Temp: 98.3 F (36.8 C)  SpO2: 95%  Weight: 194 lb 9.6 oz (88.3 kg)  Height: 5\' 5"  (1.651 m)  PainSc: 0-No pain   Body mass index is 32.38 kg/m.   Objective:  Physical Exam  Constitutional: She is oriented to person, place, and time. She appears well-developed and well-nourished.  HENT:  Head: Normocephalic and atraumatic.  Right Ear: External ear normal.  Left Ear: External ear normal.  Nose: Nose normal.  Mouth/Throat: Oropharynx is clear and moist.  Eyes: Pupils are equal, round, and reactive to light. Conjunctivae and EOM are normal.  Neck: Normal range of motion. Neck supple.  Cardiovascular: Normal rate, regular rhythm, normal heart sounds and intact distal pulses.  Pulmonary/Chest: Effort normal and breath sounds normal. No breast swelling, tenderness, discharge or bleeding. Breasts are symmetrical.  Abdominal: Soft. Bowel sounds are normal.  Musculoskeletal: Normal range of motion.  Neurological: She is alert and oriented to person, place, and time.  Skin: Skin is warm and dry. Capillary refill takes less than 2 seconds.  Psychiatric: She has a normal mood and affect. Her behavior is normal. Judgment and thought content normal.        Assessment And Plan:    1. Preventative health care  Behavior modifications discussed and diet history reviewed.    Pt  will continue to exercise regularly and modify diet with low GI, plant based foods and decrease intake of processed foods.   Recommend intake of daily multivitamin, Vitamin D, and calcium. Recommend mammogram and colonoscopy for preventive screenings, as well as recommend immunizations that include influenza, TDAP  Patient declined influenza vaccination at  this time. Patient is aware that influenza vaccine prevents illness in 70% of healthy people, and reduces hospitalizations to 30-70% in elderly. This vaccine is recommended annually. Pt is willing to accept risk associated with refusing vaccination.  2. Type 2 diabetes mellitus without complication, without long-term current use of insulin (HCC)  Chronic, controlled  Continue with current medications - CMP14 + Anion Gap - CBC no Diff - Lipid Profile - POCT Urinalysis Dipstick (81002) - POCT UA - Microalbumin  3. Hyperlipidemia, unspecified hyperlipidemia type  Chronic, controlled  Continue with current medications - Lipid Profile  4. Thyroid nodule  Doing well since her thyroidectomy - T3, free - TSH - T4 - US THYROID; Future  5. Encounter for hepatitis C screening test for low risk patient  She is in the age group of those at high risk - Hepatitis C antibody  6. Encounter for screening colonoscopy  Discussed risk factors for colon Cancer.   - Ambulatory referral to Gastroenterology      [name], FNP

## 2018-01-31 LAB — TSH: TSH: 1.54 u[IU]/mL (ref 0.450–4.500)

## 2018-01-31 LAB — CMP14 + ANION GAP
ALK PHOS: 89 IU/L (ref 39–117)
ALT: 23 IU/L (ref 0–32)
ANION GAP: 12 mmol/L (ref 10.0–18.0)
AST: 19 IU/L (ref 0–40)
Albumin/Globulin Ratio: 1.8 (ref 1.2–2.2)
Albumin: 4.4 g/dL (ref 3.5–5.5)
BUN/Creatinine Ratio: 23 (ref 9–23)
BUN: 20 mg/dL (ref 6–24)
Bilirubin Total: 0.2 mg/dL (ref 0.0–1.2)
CALCIUM: 9.7 mg/dL (ref 8.7–10.2)
CO2: 26 mmol/L (ref 20–29)
CREATININE: 0.86 mg/dL (ref 0.57–1.00)
Chloride: 106 mmol/L (ref 96–106)
GFR calc Af Amer: 86 mL/min/{1.73_m2} (ref >59)
GFR, EST NON AFRICAN AMERICAN: 74 mL/min/{1.73_m2} (ref >59)
GLUCOSE: 97 mg/dL (ref 65–99)
Globulin, Total: 2.5 g/dL (ref 1.5–4.5)
Potassium: 4.4 mmol/L (ref 3.5–5.2)
Sodium: 144 mmol/L (ref 134–144)
Total Protein: 6.9 g/dL (ref 6.0–8.5)

## 2018-01-31 LAB — LIPID PANEL
CHOLESTEROL TOTAL: 180 mg/dL (ref 100–199)
Chol/HDL Ratio: 2.6 ratio (ref 0.0–4.4)
HDL: 70 mg/dL (ref >39)
LDL CALC: 97 mg/dL (ref 0–99)
Triglycerides: 63 mg/dL (ref 0–149)
VLDL CHOLESTEROL CAL: 13 mg/dL (ref 5–40)

## 2018-01-31 LAB — CBC
HEMOGLOBIN: 12.7 g/dL (ref 11.1–15.9)
Hematocrit: 38.1 % (ref 34.0–46.6)
MCH: 29.3 pg (ref 26.6–33.0)
MCHC: 33.3 g/dL (ref 31.5–35.7)
MCV: 88 fL (ref 79–97)
PLATELETS: 255 10*3/uL (ref 150–450)
RBC: 4.33 x10E6/uL (ref 3.77–5.28)
RDW: 13.3 % (ref 12.3–15.4)
WBC: 5.6 10*3/uL (ref 3.4–10.8)

## 2018-01-31 LAB — T3, FREE: T3 FREE: 3.1 pg/mL (ref 2.0–4.4)

## 2018-01-31 LAB — HEPATITIS C ANTIBODY: Hep C Virus Ab: <0.1 s/co ratio (ref 0.0–0.9)

## 2018-01-31 LAB — T4: T4, Total: 8.6 ug/dL (ref 4.5–12.0)

## 2018-02-08 ENCOUNTER — Ambulatory Visit
Admission: RE | Admit: 2018-02-08 | Discharge: 2018-02-08 | Disposition: A | Discharge status: Home / Self Care | Payer: No Typology Code available for payment source | Source: Ambulatory Visit | Attending: Nurse Practitioner | Admitting: Nurse Practitioner

## 2018-02-08 DIAGNOSIS — E041 Nontoxic single thyroid nodule: Secondary | ICD-10-CM

## 2018-03-04 ENCOUNTER — Encounter: Payer: Self-pay | Admitting: Internal Medicine

## 2018-03-20 ENCOUNTER — Other Ambulatory Visit: Payer: Self-pay | Admitting: Nurse Practitioner

## 2018-03-20 DIAGNOSIS — Z1231 Encounter for screening mammogram for malignant neoplasm of breast: Secondary | ICD-10-CM

## 2018-03-28 ENCOUNTER — Ambulatory Visit
Admission: RE | Admit: 2018-03-28 | Discharge: 2018-03-28 | Disposition: A | Discharge status: Home / Self Care | Payer: PRIVATE HEALTH INSURANCE | Source: Ambulatory Visit | Attending: Nurse Practitioner | Admitting: Nurse Practitioner

## 2018-03-28 DIAGNOSIS — Z1231 Encounter for screening mammogram for malignant neoplasm of breast: Secondary | ICD-10-CM

## 2018-04-16 ENCOUNTER — Other Ambulatory Visit: Payer: Self-pay | Admitting: Nurse Practitioner

## 2018-07-19 ENCOUNTER — Other Ambulatory Visit: Payer: Self-pay | Admitting: Internal Medicine

## 2018-08-01 ENCOUNTER — Encounter: Payer: Self-pay | Admitting: Nurse Practitioner

## 2018-08-01 ENCOUNTER — Ambulatory Visit: Payer: PRIVATE HEALTH INSURANCE | Admitting: Nurse Practitioner

## 2018-08-01 ENCOUNTER — Other Ambulatory Visit: Payer: Self-pay

## 2018-08-01 VITALS — BP 130/68 | HR 66 | Temp 98.1°F | Ht 65.0 in | Wt 189.8 lb

## 2018-08-01 DIAGNOSIS — E785 Hyperlipidemia, unspecified: Secondary | ICD-10-CM | POA: Diagnosis not present

## 2018-08-01 DIAGNOSIS — R7303 Prediabetes: Secondary | ICD-10-CM | POA: Diagnosis not present

## 2018-08-01 DIAGNOSIS — J3489 Other specified disorders of nose and nasal sinuses: Secondary | ICD-10-CM | POA: Insufficient documentation

## 2018-08-01 MED ORDER — FLUTICASONE PROPIONATE 50 MCG/ACT NA SUSP: 2.0000 | Freq: Every day | NASAL | 2 refills | Status: DC

## 2018-08-01 NOTE — Progress Notes (Addendum)
Subjective:     Patient ID: Lori Mcdaniel , female    DOB: Apr 20, 1956 , 63 y.o.   MRN: [id number]   Chief Complaint  Patient presents with  . Diabetes    HPI  Diabetes  She presents for her follow-up diabetic visit. Diabetes type: prediabetes. Pertinent negatives for hypoglycemia include no dizziness or headaches. Pertinent negatives for diabetes include no chest pain.     Past Medical History:  Diagnosis Date  . GERD (gastroesophageal reflux disease)    years ago  . Thyroid mass of unclear etiology   . Type II diabetes mellitus (HCC)      Family History  Problem Relation Age of Onset  . Diabetes Mother   . Alzheimer's disease Mother   . Alzheimer's disease Father   . Stroke Father   . Breast cancer Neg Hx      Current Outpatient Medications:  .  atorvastatin (LIPITOR) 10 MG tablet, TAKE 1 TABLET BY MOUTH EVERY DAY, Disp: 90 tablet, Rfl: 0 .  metFORMIN (GLUCOPHAGE) 500 MG tablet, TAKE 1 TABLET BY MOUTH EVERY MORNING, Disp: 90 tablet, Rfl: 0 .  nystatin (NYSTATIN) powder, Apply topically 2 (two) times daily., Disp: , Rfl:    Allergies  Allergen Reactions  . Penicillins Nausea And Vomiting    UNSPECIFIED REACTION  "Pt has had med in last 10 yrs - was ok to take" Has patient had a PCN reaction causing immediate rash, facial/tongue/throat swelling, SOB or lightheadedness with hypotension: no Has patient had a PCN reaction causing severe rash involving mucus membranes or skin necrosis: no Has patient had a PCN reaction that required hospitalization: no Has patient had a PCN reaction occurring within the last 10 years: no If all of the above answers are "NO", then may proceed with Cephalos     Review of Systems  Constitutional: Negative.   HENT: Positive for ear pain (behind left ear) and rhinorrhea (left nostril).        Work glasses are causing discomfort behind ear  Eyes: Negative.   Respiratory: Negative.   Cardiovascular: Negative.  Negative for chest pain,  palpitations and leg swelling.  Gastrointestinal: Negative.   Skin: Negative.   Neurological: Negative.  Negative for dizziness and headaches.     Today's Vitals   08/01/18 1454  BP: 130/68  Pulse: 66  Temp: 98.1 F (36.7 C)  SpO2: 97%  Weight: 189 lb 12.8 oz (86.1 kg)  Height: 5\' 5"  (1.651 m)   Body mass index is 31.58 kg/m.   Objective:  Physical Exam Constitutional:      Appearance: Normal appearance. She is well-developed. She is obese.  HENT:     Right Ear: Tympanic membrane normal.     Left Ear: Tympanic membrane normal.     Nose: Rhinorrhea (left nostril) present. No congestion.     Mouth/Throat:     Mouth: Mucous membranes are moist.  Neck:     Musculoskeletal: Normal range of motion and neck supple.  Cardiovascular:     Rate and Rhythm: Normal rate and regular rhythm.     Pulses: Normal pulses.     Heart sounds: Normal heart sounds. No murmur.  Pulmonary:     Effort: Pulmonary effort is normal. No respiratory distress.     Breath sounds: Normal breath sounds.  Chest:     Chest wall: No tenderness.  Musculoskeletal: Normal range of motion.        General: No tenderness.  Skin:    General: Skin is  warm and dry.     Capillary Refill: Capillary refill takes less than 2 seconds.  Neurological:     General: No focal deficit present.     Mental Status: She is alert and oriented to person, place, and time.     Cranial Nerves: No cranial nerve deficit.     Sensory: No sensory deficit.  Psychiatric:        Mood and Affect: Mood normal.        Behavior: Behavior normal.        Thought Content: Thought content normal.        Judgment: Judgment normal.         Assessment And Plan:     1. Prediabetes Chronic, fair control Continue with current medications Encouraged to limit intake of sugary foods and drinks Encouraged to increase physical activity to 150 minutes per week  2. Hyperlipidemia, unspecified hyperlipidemia type  Chronic,  controlled  Continue with current medications  3. Nasal drainage  Bilateral nares with moderate hypertrophy  Will send rx for flonase if not better return call to office  Denies falls   [name], FNP    THE PATIENT IS ENCOURAGED TO PRACTICE SOCIAL DISTANCING DUE TO THE COVID-19 PANDEMIC.

## 2018-08-02 LAB — BMP8+EGFR
BUN/Creatinine Ratio: 20 (ref 12–28)
BUN: 15 mg/dL (ref 8–27)
CO2: 25 mmol/L (ref 20–29)
Calcium: 9.3 mg/dL (ref 8.7–10.3)
Chloride: 105 mmol/L (ref 96–106)
Creatinine, Ser: 0.75 mg/dL (ref 0.57–1.00)
GFR calc Af Amer: 100 mL/min/{1.73_m2} (ref >59)
GFR calc non Af Amer: 87 mL/min/{1.73_m2} (ref >59)
Glucose: 104 mg/dL — ABNORMAL HIGH (ref 65–99)
Potassium: 4.2 mmol/L (ref 3.5–5.2)
Sodium: 145 mmol/L — ABNORMAL HIGH (ref 134–144)

## 2018-08-02 LAB — LIPID PANEL
Chol/HDL Ratio: 2.8 ratio (ref 0.0–4.4)
Cholesterol, Total: 177 mg/dL (ref 100–199)
HDL: 63 mg/dL (ref >39)
LDL Calculated: 103 mg/dL — ABNORMAL HIGH (ref 0–99)
Triglycerides: 54 mg/dL (ref 0–149)
VLDL Cholesterol Cal: 11 mg/dL (ref 5–40)

## 2018-08-02 LAB — HEMOGLOBIN A1C
Est. average glucose Bld gHb Est-mCnc: 126 mg/dL
Hgb A1c MFr Bld: 6 % — ABNORMAL HIGH (ref 4.8–5.6)

## 2018-08-15 ENCOUNTER — Telehealth: Payer: Self-pay

## 2018-08-15 NOTE — Telephone Encounter (Signed)
-----   Message from [name], [place] sent at 08/14/2018  5:55 PM EDT ----- Kidney functions are normal, except you need to drink more water. HgbA1c is 6 this is prediabetic, make sure to limit intake of sugary foods and drinks.  Total cholesterol is normal at 177, LDL is 103 goal is less than 99.  Limit fried and fatty foods.

## 2018-08-15 NOTE — Telephone Encounter (Signed)
Notified patient of lab results. YRL,RMA

## 2018-08-15 NOTE — Telephone Encounter (Signed)
1st attempt to give lab results. Message left

## 2018-10-19 ENCOUNTER — Other Ambulatory Visit: Payer: Self-pay | Admitting: Nurse Practitioner

## 2019-01-15 ENCOUNTER — Telehealth: Payer: Self-pay

## 2019-01-15 NOTE — Telephone Encounter (Signed)
Patient called stating she received a call from [place] and a v/m was left but she couldn't understand it.   I RETURNED PT CALL AND ADVISED HER THAT I WASN'T SURE WHO CALLED HER BECAUSE THERE ISNT A NOTE IN HER CHART. I DID ADVISE HER THAT SHE HAS AN APPOINTMENT ON 10/14 FOR A PHYSICAL, PERHAPS IT WAS THE AUTOMATED SYSTEM TO NOTIFY HER OF THAT. Lonia Mad

## 2019-01-17 ENCOUNTER — Other Ambulatory Visit: Payer: Self-pay | Admitting: Nurse Practitioner

## 2019-02-05 ENCOUNTER — Other Ambulatory Visit: Payer: Self-pay

## 2019-02-05 ENCOUNTER — Ambulatory Visit: Payer: PRIVATE HEALTH INSURANCE | Admitting: Nurse Practitioner

## 2019-02-05 ENCOUNTER — Encounter: Payer: Self-pay | Admitting: Nurse Practitioner

## 2019-02-05 ENCOUNTER — Other Ambulatory Visit (HOSPITAL_COMMUNITY): Admission: RE | Admit: 2019-02-05 | Payer: PRIVATE HEALTH INSURANCE | Source: Ambulatory Visit

## 2019-02-05 VITALS — BP 132/70 | HR 70 | Temp 98.7°F | Ht 64.6 in | Wt 198.8 lb

## 2019-02-05 DIAGNOSIS — Z0000 Encounter for general adult medical examination without abnormal findings: Secondary | ICD-10-CM | POA: Diagnosis not present

## 2019-02-05 DIAGNOSIS — Z23 Encounter for immunization: Secondary | ICD-10-CM

## 2019-02-05 DIAGNOSIS — Z1211 Encounter for screening for malignant neoplasm of colon: Secondary | ICD-10-CM

## 2019-02-05 DIAGNOSIS — E119 Type 2 diabetes mellitus without complications: Secondary | ICD-10-CM | POA: Diagnosis not present

## 2019-02-05 DIAGNOSIS — E079 Disorder of thyroid, unspecified: Secondary | ICD-10-CM | POA: Diagnosis not present

## 2019-02-05 DIAGNOSIS — Z124 Encounter for screening for malignant neoplasm of cervix: Secondary | ICD-10-CM | POA: Diagnosis not present

## 2019-02-05 DIAGNOSIS — E785 Hyperlipidemia, unspecified: Secondary | ICD-10-CM

## 2019-02-05 DIAGNOSIS — E041 Nontoxic single thyroid nodule: Secondary | ICD-10-CM

## 2019-02-05 DIAGNOSIS — R7303 Prediabetes: Secondary | ICD-10-CM

## 2019-02-05 LAB — POCT UA - MICROALBUMIN
Albumin/Creatinine Ratio, Urine, POC: 30
Creatinine, POC: 300 mg/dL
Microalbumin Ur, POC: 10 mg/L

## 2019-02-05 LAB — POCT URINALYSIS DIPSTICK
Bilirubin, UA: NEGATIVE
Blood, UA: NEGATIVE
Glucose, UA: NEGATIVE
Ketones, UA: NEGATIVE
Leukocytes, UA: NEGATIVE
Nitrite, UA: NEGATIVE
Protein, UA: NEGATIVE
Spec Grav, UA: 1.02 (ref 1.010–1.025)
Urobilinogen, UA: 0.2 E.U./dL
pH, UA: 5.5 (ref 5.0–8.0)

## 2019-02-05 NOTE — Progress Notes (Addendum)
Subjective:     Patient ID: Lori Mcdaniel , female    DOB: 12/05/1955 , 62 y.o.   MRN: [id number]   She works at [employer], they are practicing social distancing.  Diabetes She presents for her follow-up diabetic visit. She has type 2 diabetes mellitus. Her disease course has been stable. There are no hypoglycemic associated symptoms. There are no diabetic associated symptoms. There are no hypoglycemic complications. There are no diabetic complications. Risk factors for coronary artery disease include diabetes mellitus, obesity and sedentary lifestyle. Current diabetic treatment includes oral agent (monotherapy). She is compliant with treatment most of the time. When asked about meal planning, she reported none. She rarely participates in exercise. She does not see a podiatrist.Eye exam current: she is planning to schedule an eye appt.      Preventive Exam Patient is menopausal.  Negative for: breast discharge, breast lump(s), breast pain and breast self exam. Associated symptoms include abnormal vaginal bleeding. Pertinent negatives include abnormal bleeding (hematology), anxiety, decreased libido, depression, difficulty falling sleep, dyspareunia, history of infertility, nocturia, sexual dysfunction, sleep disturbances, urinary incontinence, urinary urgency, vaginal discharge and vaginal itching. Diet regular.The patient states her exercise level is  rarely, she will try to get a routine started.  She is seeing a wellness coach at work.  She averages 15,000 - 20,000 steps per day.       The patient's tobacco use is:  Social History   Tobacco Use  Smoking Status Never Smoker  Smokeless Tobacco Never Used   She has been exposed to passive smoke. The patient's alcohol use is:  Social History   Substance and Sexual Activity  Alcohol Use No  . Frequency: Never   Additional information: Last pap 06/2015, next one will be done today.  Past Medical History:  Diagnosis Date  . GERD  (gastroesophageal reflux disease)    years ago  . Thyroid mass of unclear etiology   . Type II diabetes mellitus (HCC)       Current Outpatient Medications:  .  atorvastatin (LIPITOR) 10 MG tablet, TAKE 1 TABLET BY MOUTH EVERY DAY, Disp: 90 tablet, Rfl: 0 .  metFORMIN (GLUCOPHAGE) 500 MG tablet, TAKE 1 TABLET BY MOUTH EVERY MORNING, Disp: 90 tablet, Rfl: 0 .  fluticasone (FLONASE) 50 MCG/ACT nasal spray, Place 2 sprays into both nostrils daily. (Patient not taking: Reported on 02/05/2019), Disp: 16 g, Rfl: 2 .  nystatin (NYSTATIN) powder, Apply topically 2 (two) times daily., Disp: , Rfl:    Review of Systems  Constitutional: Negative.   HENT: Negative for congestion, sinus pressure, sinus pain and sore throat.        Pressure to the back of her head intermittently  Eyes: Negative.   Respiratory: Negative.   Cardiovascular: Negative.   Gastrointestinal: Negative.   Endocrine: Negative.   Genitourinary: Negative.   Musculoskeletal: Negative.   Skin: Negative.   Allergic/Immunologic: Negative.   Neurological: Negative.   Hematological: Negative.   Psychiatric/Behavioral: Negative.      Today's Vitals   02/05/19 1428  BP: 132/70  Pulse: 70  Temp: 98.7 F (37.1 C)  TempSrc: Oral  Weight: 198 lb 12.8 oz (90.2 kg)  Height: 5' 4.6" (1.641 m)  PainSc: 0-No pain   Body mass index is 33.49 kg/m.   Objective:  Physical Exam Constitutional:      General: She is not in acute distress.    Appearance: Normal appearance. She is well-developed. She is obese.  HENT:       Head: Normocephalic and atraumatic.     Right Ear: Tympanic membrane, ear canal and external ear normal. There is no impacted cerumen.     Left Ear: Tympanic membrane, ear canal and external ear normal. There is no impacted cerumen.  Eyes:     Extraocular Movements: Extraocular movements intact.     Conjunctiva/sclera: Conjunctivae normal.     Pupils: Pupils are equal, round, and reactive to light.  Neck:      Musculoskeletal: Normal range of motion and neck supple.  Cardiovascular:     Rate and Rhythm: Normal rate and regular rhythm.     Pulses: Normal pulses.     Heart sounds: Normal heart sounds. No murmur.  Pulmonary:     Effort: Pulmonary effort is normal. No respiratory distress.     Breath sounds: Normal breath sounds.  Chest:     Breasts: Breasts are symmetrical.   Abdominal:     General: Abdomen is flat. Bowel sounds are normal.     Palpations: Abdomen is soft.  Genitourinary:    Pubic Area: No rash.      Labia:        Right: No tenderness.        Left: No tenderness.      Vagina: Normal.     Cervix: Normal.     Uterus: Normal.      Adnexa: Right adnexa normal and left adnexa normal.  Musculoskeletal: Normal range of motion.  Skin:    General: Skin is warm and dry.     Capillary Refill: Capillary refill takes less than 2 seconds.  Neurological:     General: No focal deficit present.     Mental Status: She is alert and oriented to person, place, and time.  Psychiatric:        Mood and Affect: Mood normal.        Behavior: Behavior normal.        Thought Content: Thought content normal.        Judgment: Judgment normal.         Assessment And Plan:    1. Preventative health care  Behavior modifications discussed and diet history reviewed.    Pt will continue to exercise regularly and modify diet with low GI, plant based foods and decrease intake of processed foods.   Recommend intake of daily multivitamin, Vitamin D, and calcium. Recommend mammogram and colonoscopy for preventive screenings, as well as recommend immunizations that include influenza, TDAP. - POCT Urinalysis Dipstick (81002) - POCT UA - Microalbumin - CMP14+EGFR - CBC - Hemoglobin A1c - Ambulatory referral to Podiatry  2. Type 2 diabetes mellitus without complication, without long-term current use of insulin (HCC)  Chronic, controlled  Continue with current medications - CMP14 + Anion Gap -  CBC no Diff - Lipid Profile - POCT Urinalysis Dipstick (81002) - POCT UA - Microalbumin  3. Hyperlipidemia, unspecified hyperlipidemia type  Chronic, controlled  Continue with current medications - Lipid Profile  4. Thyroid nodule  Doing well since her thyroidectomy - T3, free - TSH - T4 - US Soft Tissue Head/Neck; Future  5. Hyperlipidemia, unspecified hyperlipidemia type  Chronic, controlled  Continue with current medications  No issues with medications - Lipid panel - VITAMIN D 25 Hydroxy (Vit-D Deficiency, Fractures)  6. Prediabetes Chronic, encouraged to increase physical activity and eat a diet low in carbohydrates and sugars - VITAMIN D 25 Hydroxy (Vit-D Deficiency, Fractures) - Ambulatory referral to Ophthalmology  7. Encounter for screening colonoscopy  She  did not do her colonoscopy will see if she can do a cologuard denies family history of colon cancer - Cologuard  8. Encounter for Papanicolaou smear of cervix  No abnormal findings  9. Encounter for immunization  - Pneumococcal polysaccharide vaccine 23-valent greater than or equal to 2yo subcutaneous/IM      Janece Moore, FNP  

## 2019-02-05 NOTE — Patient Instructions (Signed)
Health Maintenance  Topic Date Due  . OPHTHALMOLOGY EXAM  04/16/1968  . COLONOSCOPY  04/16/2008  . PAP SMEAR-Modifier  06/23/2018  . FOOT EXAM  01/31/2019  . HEMOGLOBIN A1C  01/31/2019  . URINE MICROALBUMIN  01/31/2019  . MAMMOGRAM  03/28/2020  . TETANUS/TDAP  07/18/2025  . INFLUENZA VACCINE  Completed  . PNEUMOCOCCAL POLYSACCHARIDE VACCINE AGE 61-64 HIGH RISK  Completed  . Hepatitis C Screening  Completed  . HIV Screening  Completed   Health Maintenance, Female Adopting a healthy lifestyle and getting preventive care are important in promoting health and wellness. Ask your health care provider about:  The right schedule for you to have regular tests and exams.  Things you can do on your own to prevent diseases and keep yourself healthy. What should I know about diet, weight, and exercise? Eat a healthy diet   Eat a diet that includes plenty of vegetables, fruits, low-fat dairy products, and lean protein.  Do not eat a lot of foods that are high in solid fats, added sugars, or sodium. Maintain a healthy weight Body mass index (BMI) is used to identify weight problems. It estimates body fat based on height and weight. Your health care provider can help determine your BMI and help you achieve or maintain a healthy weight. Get regular exercise Get regular exercise. This is one of the most important things you can do for your health. Most adults should:  Exercise for at least 150 minutes each week. The exercise should increase your heart rate and make you sweat (moderate-intensity exercise).  Do strengthening exercises at least twice a week. This is in addition to the moderate-intensity exercise.  Spend less time sitting. Even light physical activity can be beneficial. Watch cholesterol and blood lipids Have your blood tested for lipids and cholesterol at 61 years of age, then have this test every 5 years. Have your cholesterol levels checked more often if:  Your lipid or  cholesterol levels are high.  You are older than 61 years of age.  You are at high risk for heart disease. What should I know about cancer screening? Depending on your health history and family history, you may need to have cancer screening at various ages. This may include screening for:  Breast cancer.  Cervical cancer.  Colorectal cancer.  Skin cancer.  Lung cancer. What should I know about heart disease, diabetes, and high blood pressure? Blood pressure and heart disease  High blood pressure causes heart disease and increases the risk of stroke. This is more likely to develop in people who have high blood pressure readings, are of African descent, or are overweight.  Have your blood pressure checked: ? Every 3-5 years if you are 39-43 years of age. ? Every year if you are 70 years old or older. Diabetes Have regular diabetes screenings. This checks your fasting blood sugar level. Have the screening done:  Once every three years after age 22 if you are at a normal weight and have a low risk for diabetes.  More often and at a younger age if you are overweight or have a high risk for diabetes. What should I know about preventing infection? Hepatitis B If you have a higher risk for hepatitis B, you should be screened for this virus. Talk with your health care provider to find out if you are at risk for hepatitis B infection. Hepatitis C Testing is recommended for:  Everyone born from [birth year] through 1963.  Anyone with known risk factors  for hepatitis C. Sexually transmitted infections (STIs)  Get screened for STIs, including gonorrhea and chlamydia, if: ? You are sexually active and are younger than 61 years of age. ? You are older than 61 years of age and your health care provider tells you that you are at risk for this type of infection. ? Your sexual activity has changed since you were last screened, and you are at increased risk for chlamydia or gonorrhea. Ask your  health care provider if you are at risk.  Ask your health care provider about whether you are at high risk for HIV. Your health care provider may recommend a prescription medicine to help prevent HIV infection. If you choose to take medicine to prevent HIV, you should first get tested for HIV. You should then be tested every 3 months for as long as you are taking the medicine. Pregnancy  If you are about to stop having your period (premenopausal) and you may become pregnant, seek counseling before you get pregnant.  Take 400 to 800 micrograms (mcg) of folic acid every day if you become pregnant.  Ask for birth control (contraception) if you want to prevent pregnancy. Osteoporosis and menopause Osteoporosis is a disease in which the bones lose minerals and strength with aging. This can result in bone fractures. If you are 66 years old or older, or if you are at risk for osteoporosis and fractures, ask your health care provider if you should:  Be screened for bone loss.  Take a calcium or vitamin D supplement to lower your risk of fractures.  Be given hormone replacement therapy (HRT) to treat symptoms of menopause. Follow these instructions at home: Lifestyle  Do not use any products that contain nicotine or tobacco, such as cigarettes, e-cigarettes, and chewing tobacco. If you need help quitting, ask your health care provider.  Do not use street drugs.  Do not share needles.  Ask your health care provider for help if you need support or information about quitting drugs. Alcohol use  Do not drink alcohol if: ? Your health care provider tells you not to drink. ? You are pregnant, may be pregnant, or are planning to become pregnant.  If you drink alcohol: ? Limit how much you use to 0-1 drink a day. ? Limit intake if you are breastfeeding.  Be aware of how much alcohol is in your drink. In the [place] one drink equals one 12 oz bottle of beer (355 mL), one 5 oz glass of wine (148  mL), or one 1 oz glass of hard liquor (44 mL). General instructions  Schedule regular health, dental, and eye exams.  Stay current with your vaccines.  Tell your health care provider if: ? You often feel depressed. ? You have ever been abused or do not feel safe at home. Summary  Adopting a healthy lifestyle and getting preventive care are important in promoting health and wellness.  Follow your health care provider's instructions about healthy diet, exercising, and getting tested or screened for diseases.  Follow your health care provider's instructions on monitoring your cholesterol and blood pressure. This information is not intended to replace advice given to you by your health care provider. Make sure you discuss any questions you have with your health care provider. Document Released: 10/24/2010 Document Revised: 04/03/2018 Document Reviewed: 04/03/2018 Elsevier Patient Education  2020 [employer].

## 2019-02-06 LAB — CMP14+EGFR
ALT: 16 IU/L (ref 0–32)
AST: 18 IU/L (ref 0–40)
Albumin/Globulin Ratio: 1.9 (ref 1.2–2.2)
Albumin: 4.3 g/dL (ref 3.8–4.9)
Alkaline Phosphatase: 107 IU/L (ref 39–117)
BUN/Creatinine Ratio: 16 (ref 12–28)
BUN: 14 mg/dL (ref 8–27)
Bilirubin Total: 0.3 mg/dL (ref 0.0–1.2)
CO2: 25 mmol/L (ref 20–29)
Calcium: 9.5 mg/dL (ref 8.7–10.3)
Chloride: 103 mmol/L (ref 96–106)
Creatinine, Ser: 0.85 mg/dL (ref 0.57–1.00)
GFR calc Af Amer: 86 mL/min/{1.73_m2} (ref >59)
GFR calc non Af Amer: 75 mL/min/{1.73_m2} (ref >59)
Globulin, Total: 2.3 g/dL (ref 1.5–4.5)
Glucose: 85 mg/dL (ref 65–99)
Potassium: 4.1 mmol/L (ref 3.5–5.2)
Sodium: 141 mmol/L (ref 134–144)
Total Protein: 6.6 g/dL (ref 6.0–8.5)

## 2019-02-06 LAB — CBC
Hematocrit: 37.9 % (ref 34.0–46.6)
Hemoglobin: 12.6 g/dL (ref 11.1–15.9)
MCH: 29.4 pg (ref 26.6–33.0)
MCHC: 33.2 g/dL (ref 31.5–35.7)
MCV: 88 fL (ref 79–97)
Platelets: 241 10*3/uL (ref 150–450)
RBC: 4.29 x10E6/uL (ref 3.77–5.28)
RDW: 13.2 % (ref 11.7–15.4)
WBC: 5.7 10*3/uL (ref 3.4–10.8)

## 2019-02-06 LAB — LIPID PANEL
Chol/HDL Ratio: 2.6 ratio (ref 0.0–4.4)
Cholesterol, Total: 165 mg/dL (ref 100–199)
HDL: 63 mg/dL (ref >39)
LDL Chol Calc (NIH): 92 mg/dL (ref 0–99)
Triglycerides: 48 mg/dL (ref 0–149)
VLDL Cholesterol Cal: 10 mg/dL (ref 5–40)

## 2019-02-06 LAB — VITAMIN D 25 HYDROXY (VIT D DEFICIENCY, FRACTURES): Vit D, 25-Hydroxy: 24.9 ng/mL — ABNORMAL LOW (ref 30.0–100.0)

## 2019-02-06 LAB — HEMOGLOBIN A1C
Est. average glucose Bld gHb Est-mCnc: 123 mg/dL
Hgb A1c MFr Bld: 5.9 % — ABNORMAL HIGH (ref 4.8–5.6)

## 2019-02-07 LAB — CYTOLOGY - PAP: Diagnosis: NEGATIVE

## 2019-02-10 ENCOUNTER — Telehealth: Payer: Self-pay

## 2019-02-10 NOTE — Telephone Encounter (Signed)
Called pt gave her the provider message   [name], FNP  Candiss [place]se T, [place]        Your pap is normal, your next one is due in 3 years. Kidney and liver functions are normal. Blood levels are normal. Cholesterol levels are normal. Vitamin d is 24.9 goal is greater than 30, I am sending a Rx for vitamin d 50,000 units once a week. HgbA1c is slightly lower at 5.9 this is still prediabetic, increase physical activity and avoid sugary foods and drinks.

## 2019-02-24 ENCOUNTER — Telehealth: Payer: Self-pay

## 2019-02-24 NOTE — Telephone Encounter (Signed)
Patient called stating she was supposed to get a call today at 10:30 but she is still at work and wont be off until later.    I RETURNED PT CALL AND LEFT HER A V/M TO CALL THE OFFICE I DONT SEE AN APPOINTMENT SCHEDULED FOR PT. Lori Mcdaniel

## 2019-02-27 LAB — COLOGUARD: Cologuard: NEGATIVE

## 2019-03-03 ENCOUNTER — Encounter: Payer: Self-pay | Admitting: Nurse Practitioner

## 2019-03-04 ENCOUNTER — Ambulatory Visit
Admission: RE | Admit: 2019-03-04 | Discharge: 2019-03-04 | Disposition: A | Discharge status: Home / Self Care | Payer: PRIVATE HEALTH INSURANCE | Source: Ambulatory Visit | Attending: Nurse Practitioner | Admitting: Nurse Practitioner

## 2019-03-04 DIAGNOSIS — E041 Nontoxic single thyroid nodule: Secondary | ICD-10-CM

## 2019-03-07 ENCOUNTER — Other Ambulatory Visit: Payer: Self-pay

## 2019-03-07 ENCOUNTER — Ambulatory Visit: Payer: PRIVATE HEALTH INSURANCE | Admitting: Podiatry

## 2019-03-07 ENCOUNTER — Encounter: Payer: Self-pay | Admitting: Podiatry

## 2019-03-07 VITALS — BP 157/83 | HR 63

## 2019-03-07 DIAGNOSIS — M2010 Hallux valgus (acquired), unspecified foot: Secondary | ICD-10-CM | POA: Insufficient documentation

## 2019-03-07 DIAGNOSIS — E119 Type 2 diabetes mellitus without complications: Secondary | ICD-10-CM

## 2019-03-07 NOTE — Progress Notes (Signed)
This patient presents to the office for an evaluation of her diabetic feet.  She presents the office stating she is not having any foot problems today.  She says she occasionally has problems with her quadriceps muscle on her right leg which she states could be related to restless leg syndrome.  She presents to the office today per her doctor for an evaluation and check of her diabetic feet.  General Appearance  Alert, conversant and in no acute stress.  Vascular  Dorsalis pedis and posterior tibial  pulses are palpable  bilaterally.  Capillary return is within normal limits  bilaterally. Temperature is within normal limits  bilaterally.  Neurologic  Senn-Weinstein monofilament wire test within normal limits  bilaterally. Muscle power within normal limits bilaterally.  Nails normal nails with no evidence of any thick disfigured discoloration.  No evidence of any drainage or infection to the nails noted.  Orthopedic  No limitations of motion of motion feet .  No crepitus or effusions noted.  No bony pathology or digital deformities noted. 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  Skin  normotropic skin with no porokeratosis noted bilaterally.  No signs of infections or ulcers noted.     Onychomycosis  Diabetes with no foot complications  IE    A diabetic foot exam was performed and there is no evidence of any vascular or neurologic pathology.  Discussed shoelace techniques for this patients arthritis.    RTC 1 year.Gardiner Barefoot DPM

## 2019-04-15 ENCOUNTER — Other Ambulatory Visit: Payer: Self-pay | Admitting: Nurse Practitioner

## 2019-04-15 DIAGNOSIS — E041 Nontoxic single thyroid nodule: Secondary | ICD-10-CM

## 2019-04-17 ENCOUNTER — Other Ambulatory Visit: Payer: Self-pay | Admitting: Nurse Practitioner

## 2019-05-07 ENCOUNTER — Other Ambulatory Visit: Payer: Self-pay | Admitting: Otolaryngology

## 2019-05-07 DIAGNOSIS — D170 Benign lipomatous neoplasm of skin and subcutaneous tissue of head, face and neck: Secondary | ICD-10-CM

## 2019-05-07 DIAGNOSIS — E041 Nontoxic single thyroid nodule: Secondary | ICD-10-CM

## 2019-05-13 IMAGING — US US THYROID
1 series · 13 of 25 positions shown · non-contrast
Comparison: 02/12/2017

CLINICAL DATA: Prior ultrasound follow-up. Status post right
thyroidectomy with removal of 5 cm follicular adenoma in June 2017. A 1.8 cm isthmus nodule met criteria for 1 year follow-up
ultrasound by prior imaging.

EXAM:
THYROID ULTRASOUND
TECHNIQUE: Ultrasound examination of the thyroid gland and adjacent soft
tissues was performed.

[Series 1: us thyroid · 0.08mm/px · 51 acquisitions, 13 frames shown]
[im 1/51]
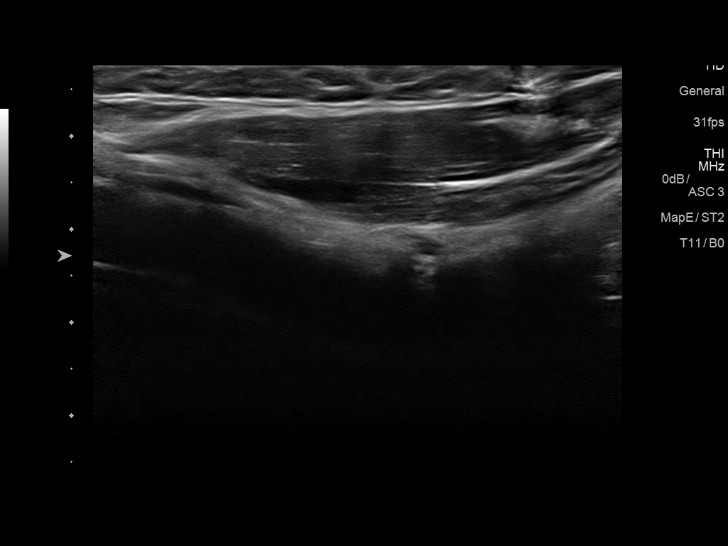
[im 5/51]
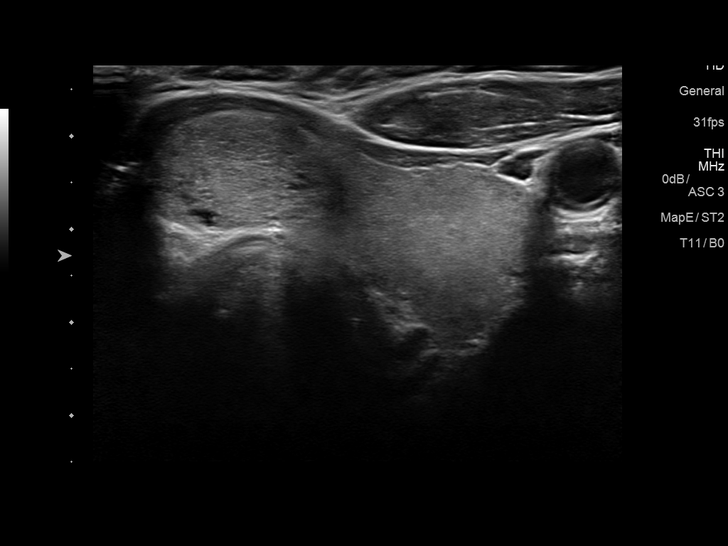
[im 9/51]
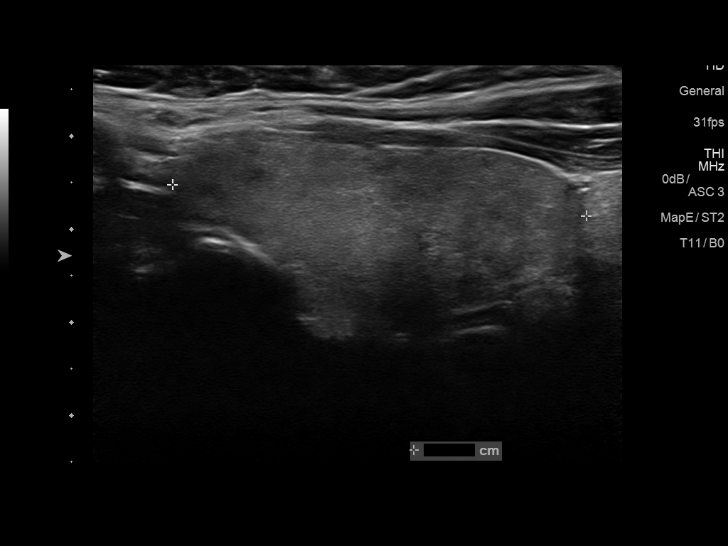
[im 13/51]
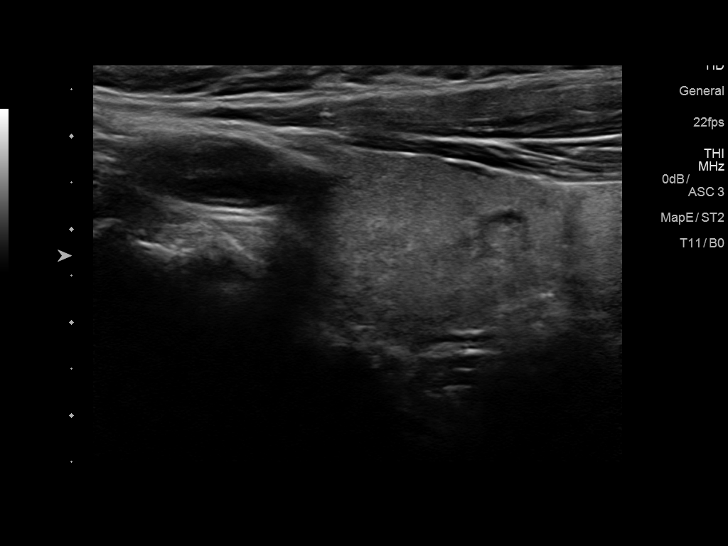
[im 17/51]
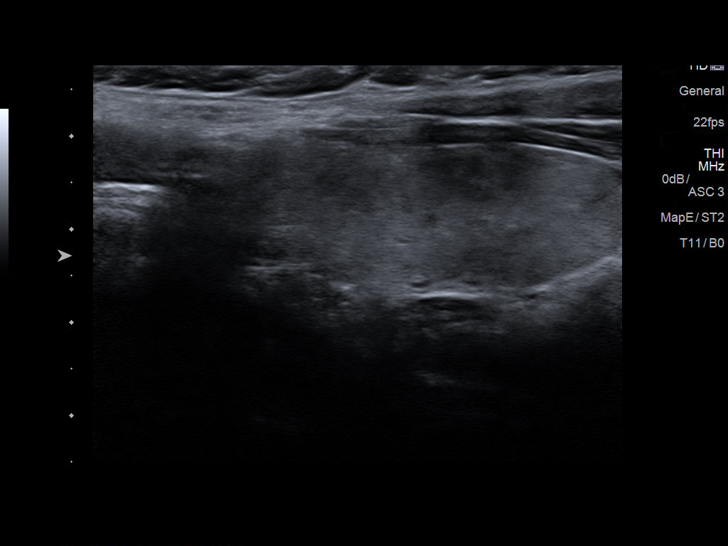
[im 21/51]
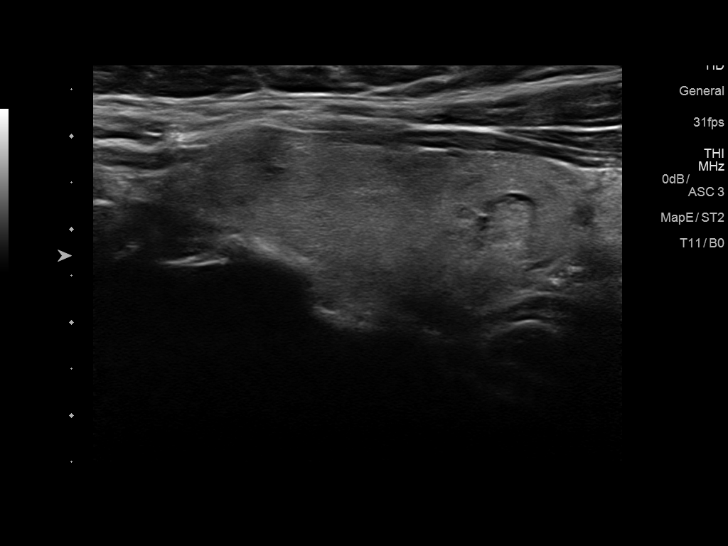
[im 26/51]
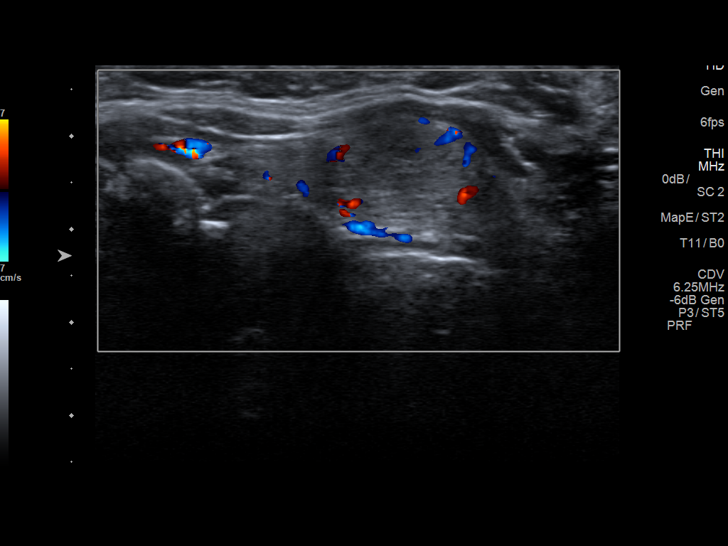
[im 30/51]
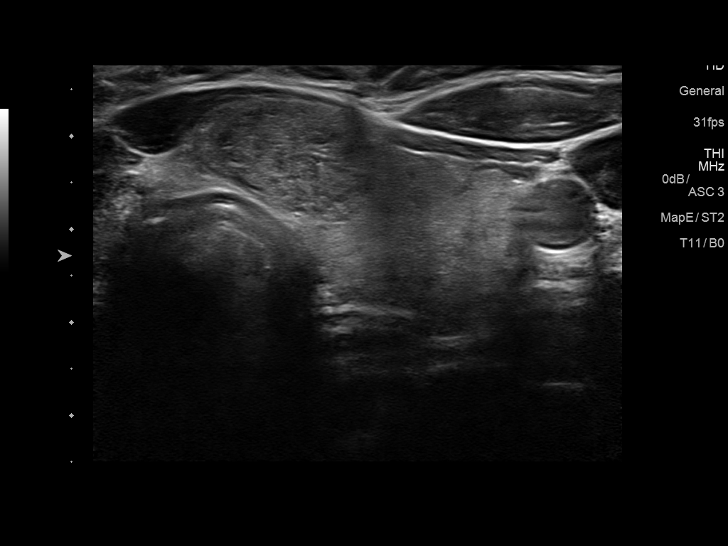
[im 34/51]
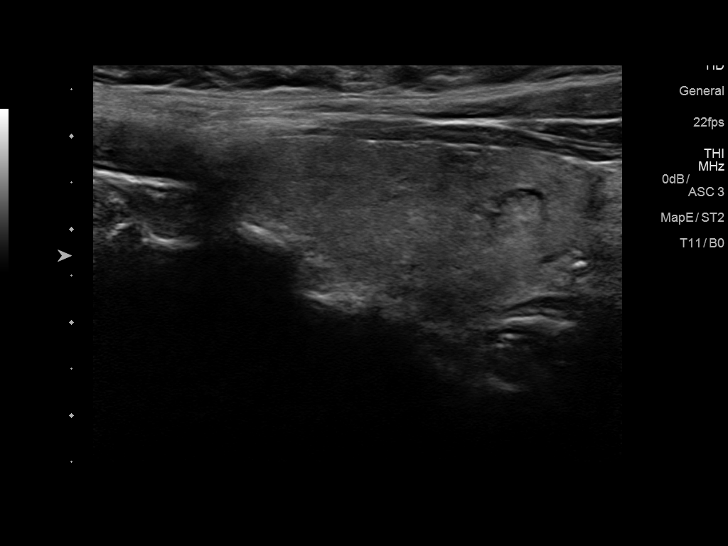
[im 38/51]
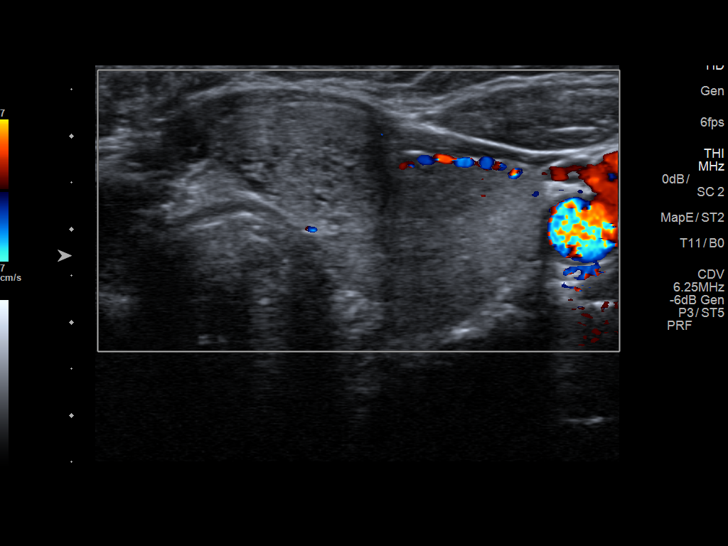
[im 42/51]
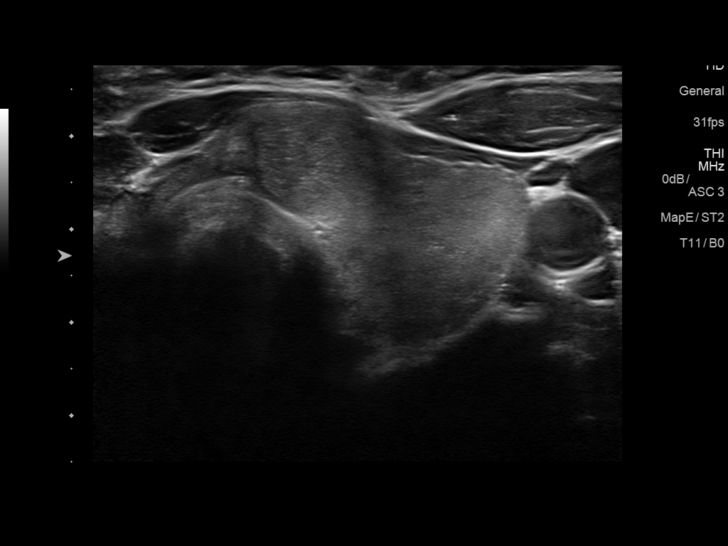
[im 46/51]
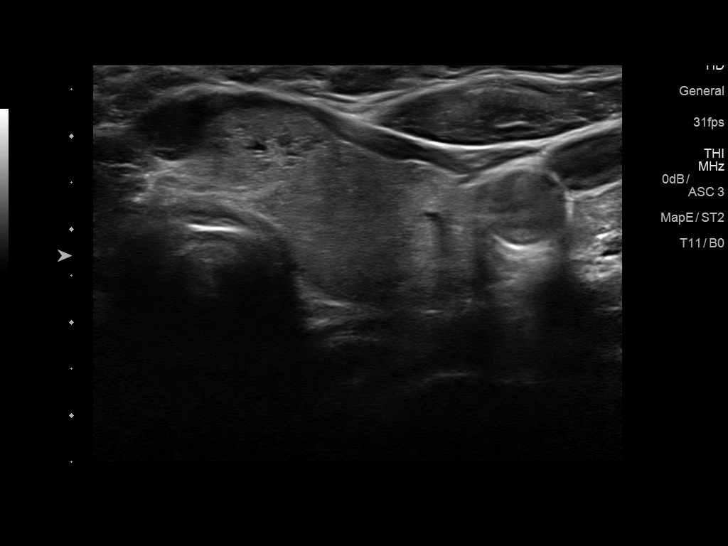
[im 51/51]
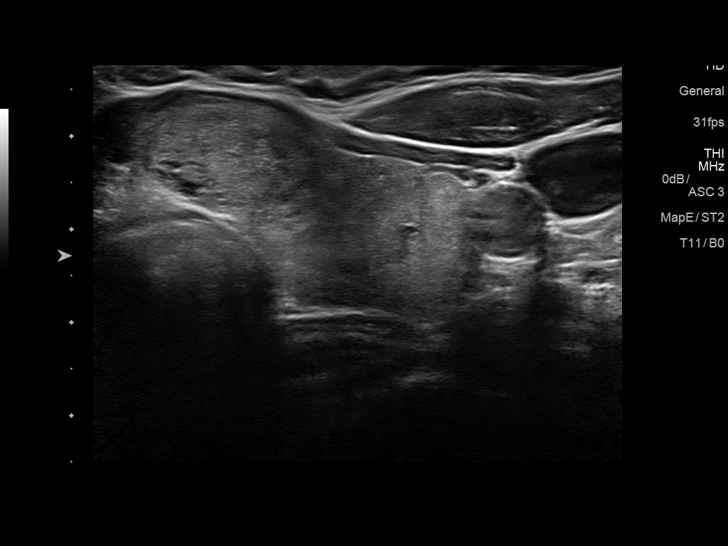

[13 of 25 positions shown; findings below may reference images not displayed]

FINDINGS: Parenchymal Echotexture: Mildly heterogenous

Isthmus: 0.3 cm

Right lobe: Surgically absent.

Left lobe: 4.5 x 2.1 x 2.2 cm

_________________________________________________________

Estimated total number of nodules >/= 1 cm: 1

Number of spongiform nodules >/=  2 cm not described below (TR1): 0

Number of mixed cystic and solid nodules >/= 1.5 cm not described
below (TR2): 0

_________________________________________________________

Nodule # 1:

Prior biopsy: No

Location: Isthmus

Maximum size: 1.8 cm; Other 2 dimensions: 1.8 x 1.3 cm, previously,
1.8 cm

Composition: solid/almost completely solid (2)

Echogenicity: isoechoic (1)

Shape: not taller-than-wide (0)

Margins: smooth (0)

Echogenic foci: none (0)

ACR TI-RADS total points: 3.

ACR TI-RADS risk category:  TR3 (3 points).

Significant change in size (>/= 20% in two dimensions and minimal
increase of 2 mm): No

Change in features: No

Change in ACR TI-RADS risk category: No

ACR TI-RADS recommendations:

Stable isthmus nodule. *Given size (>/= 1.5 - 2.4 cm) and
appearance, a follow-up ultrasound in 1 year should be considered
based on TI-RADS criteria.

_________________________________________________________

No abnormal soft tissue or residual thyroid tissue is identified in
the right neck. No abnormal lymph nodes identified.
IMPRESSION: Stable 1.8 cm thyroid isthmus nodule. Recommend follow-up ultrasound
in 1 year.

The above is in keeping with the ACR TI-RADS recommendations - [HOSPITAL] 8360;[DATE].

## 2019-07-16 ENCOUNTER — Other Ambulatory Visit: Payer: Self-pay | Admitting: Nurse Practitioner

## 2019-07-21 ENCOUNTER — Ambulatory Visit (HOSPITAL_COMMUNITY)
Admission: EM | Admit: 2019-07-21 | Discharge: 2019-07-21 | Discharge status: Against Medical Advice | Payer: PRIVATE HEALTH INSURANCE

## 2019-07-21 ENCOUNTER — Other Ambulatory Visit: Payer: Self-pay

## 2019-07-21 NOTE — ED Notes (Signed)
Pt thought she could just be covid tested and not see a provider so she went to a different location.

## 2019-07-23 ENCOUNTER — Other Ambulatory Visit: Payer: Self-pay | Admitting: Nurse Practitioner

## 2019-07-23 DIAGNOSIS — Z1231 Encounter for screening mammogram for malignant neoplasm of breast: Secondary | ICD-10-CM

## 2019-07-28 ENCOUNTER — Other Ambulatory Visit: Payer: Self-pay

## 2019-07-28 ENCOUNTER — Ambulatory Visit: Payer: PRIVATE HEALTH INSURANCE

## 2019-07-28 ENCOUNTER — Ambulatory Visit
Admission: RE | Admit: 2019-07-28 | Discharge: 2019-07-28 | Disposition: A | Discharge status: Home / Self Care | Payer: PRIVATE HEALTH INSURANCE | Source: Ambulatory Visit | Attending: Nurse Practitioner | Admitting: Nurse Practitioner

## 2019-07-28 DIAGNOSIS — Z1231 Encounter for screening mammogram for malignant neoplasm of breast: Secondary | ICD-10-CM

## 2019-08-06 ENCOUNTER — Encounter: Payer: Self-pay | Admitting: Nurse Practitioner

## 2019-08-06 ENCOUNTER — Other Ambulatory Visit: Payer: Self-pay

## 2019-08-06 ENCOUNTER — Ambulatory Visit: Payer: PRIVATE HEALTH INSURANCE | Admitting: Nurse Practitioner

## 2019-08-06 VITALS — BP 112/78 | HR 75 | Temp 98.1°F | Ht 64.6 in | Wt 192.6 lb

## 2019-08-06 DIAGNOSIS — E785 Hyperlipidemia, unspecified: Secondary | ICD-10-CM | POA: Diagnosis not present

## 2019-08-06 DIAGNOSIS — E559 Vitamin D deficiency, unspecified: Secondary | ICD-10-CM | POA: Diagnosis not present

## 2019-08-06 DIAGNOSIS — R7303 Prediabetes: Secondary | ICD-10-CM | POA: Diagnosis not present

## 2019-08-06 DIAGNOSIS — R002 Palpitations: Secondary | ICD-10-CM

## 2019-08-06 MED ORDER — VITAMIN D (ERGOCALCIFEROL) 1.25 MG (50000 UNIT) PO CAPS: 50000.0000 [IU] | ORAL_CAPSULE | ORAL | 0 refills | Status: DC

## 2019-08-06 NOTE — Progress Notes (Signed)
Subjective:     Patient ID: Lori Mcdaniel , female    DOB: Jul 28, 1955 , 64 y.o.   MRN: [id number]   Chief Complaint  Patient presents with  . Palpitations  . Diabetes    HPI  She has been exposed to covid late March and was tested, was negative.   Diabetes She presents for her follow-up diabetic visit. Diabetes type: prediabetes. Pertinent negatives for hypoglycemia include no dizziness or headaches. Pertinent negatives for diabetes include no chest pain, no polydipsia, no polyphagia and no polyuria. There are no diabetic complications. Risk factors for coronary artery disease include obesity and sedentary lifestyle. She is following a generally unhealthy diet. She has not had a previous visit with a dietitian. She rarely participates in exercise.  Palpitations  This is a new problem. The current episode started in the past 7 days. The problem occurs intermittently. The symptoms are aggravated by caffeine. Pertinent negatives include no chest pain or dizziness. Treatments tried: cut back on coffee. Risk factors include obesity and sedentary lifestyle.     Past Medical History:  Diagnosis Date  . GERD (gastroesophageal reflux disease)    years ago  . Thyroid mass of unclear etiology   . Type II diabetes mellitus (HCC)      Family History  Problem Relation Age of Onset  . Diabetes Mother   . Alzheimer's disease Mother   . Alzheimer's disease Father   . Stroke Father   . Breast cancer Neg Hx      Current Outpatient Medications:  .  atorvastatin (LIPITOR) 10 MG tablet, TAKE 1 TABLET BY MOUTH EVERY DAY, Disp: 90 tablet, Rfl: 0 .  metFORMIN (GLUCOPHAGE) 500 MG tablet, TAKE 1 TABLET BY MOUTH EVERY MORNING, Disp: 90 tablet, Rfl: 0   Allergies  Allergen Reactions  . Penicillins Nausea And Vomiting    UNSPECIFIED REACTION  "Pt has had med in last 10 yrs - was ok to take" Has patient had a PCN reaction causing immediate rash, facial/tongue/throat swelling, SOB or  lightheadedness with hypotension: no Has patient had a PCN reaction causing severe rash involving mucus membranes or skin necrosis: no Has patient had a PCN reaction that required hospitalization: no Has patient had a PCN reaction occurring within the last 10 years: no If all of the above answers are "NO", then may proceed with Cephalos     Review of Systems  Constitutional: Negative.   HENT:       Work glasses are causing discomfort behind ear  Eyes: Negative.   Respiratory: Negative.   Cardiovascular: Positive for palpitations. Negative for chest pain and leg swelling.  Gastrointestinal: Negative.   Endocrine: Negative for polydipsia, polyphagia and polyuria.  Neurological: Negative.  Negative for dizziness and headaches.  Psychiatric/Behavioral: Negative.       Today's Vitals   08/06/19 1542  BP: 112/78  Pulse: 75  Temp: 98.1 F (36.7 C)  Weight: 192 lb 9.6 oz (87.4 kg)  Height: 5' 4.6" (1.641 m)  PainSc: 0-No pain   Body mass index is 32.45 kg/m.   Objective:  Physical Exam Constitutional:      General: She is not in acute distress.    Appearance: Normal appearance. She is well-developed. She is obese.  HENT:     Nose: Rhinorrhea: left nostril.  Cardiovascular:     Rate and Rhythm: Normal rate and regular rhythm.     Pulses: Normal pulses.     Heart sounds: Normal heart sounds. No murmur.  Pulmonary:  Effort: Pulmonary effort is normal. No respiratory distress.     Breath sounds: Normal breath sounds.  Chest:     Chest wall: No tenderness.  Musculoskeletal:        General: No tenderness. Normal range of motion.     Cervical back: Normal range of motion and neck supple.  Skin:    General: Skin is warm and dry.     Capillary Refill: Capillary refill takes less than 2 seconds.  Neurological:     General: No focal deficit present.     Mental Status: She is alert and oriented to person, place, and time.     Cranial Nerves: No cranial nerve deficit.      Sensory: No sensory deficit.  Psychiatric:        Mood and Affect: Mood normal.        Behavior: Behavior normal.        Thought Content: Thought content normal.        Judgment: Judgment normal.         Assessment And Plan:     1. Prediabetes  Chronic, continue with current medications - BMP8+eGFR - Hemoglobin A1c  2. Hyperlipidemia, unspecified hyperlipidemia type  Chronic, no current medications  - Lipid panel  3. Palpitations  Advised to avoid caffeine   Stay well hydrated with water  Will check thyroid levels   EKG done with NSR - TSH - CBC  4. Vitamin D deficiency  Also encouraged to spend 15 minutes in the sun daily.  - Vitamin D, Ergocalciferol, (DRISDOL) 1.25 MG (50000 UNIT) CAPS capsule; Take 1 capsule (50,000 Units total) by mouth every 7 (seven) days.  Dispense: 12 capsule; Refill: 0   [name], FNP    THE PATIENT IS ENCOURAGED TO PRACTICE SOCIAL DISTANCING DUE TO THE COVID-19 PANDEMIC.

## 2019-08-07 ENCOUNTER — Other Ambulatory Visit: Payer: Self-pay | Admitting: Nurse Practitioner

## 2019-08-07 DIAGNOSIS — R7303 Prediabetes: Secondary | ICD-10-CM

## 2019-08-07 LAB — BMP8+EGFR
BUN/Creatinine Ratio: 20 (ref 12–28)
BUN: 17 mg/dL (ref 8–27)
CO2: 24 mmol/L (ref 20–29)
Calcium: 9.4 mg/dL (ref 8.7–10.3)
Chloride: 105 mmol/L (ref 96–106)
Creatinine, Ser: 0.84 mg/dL (ref 0.57–1.00)
GFR calc Af Amer: 87 mL/min/{1.73_m2} (ref >59)
GFR calc non Af Amer: 75 mL/min/{1.73_m2} (ref >59)
Glucose: 89 mg/dL (ref 65–99)
Potassium: 4 mmol/L (ref 3.5–5.2)
Sodium: 139 mmol/L (ref 134–144)

## 2019-08-07 LAB — HEMOGLOBIN A1C
Est. average glucose Bld gHb Est-mCnc: 126 mg/dL
Hgb A1c MFr Bld: 6 % — ABNORMAL HIGH (ref 4.8–5.6)

## 2019-08-07 LAB — CBC
Hematocrit: 38.4 % (ref 34.0–46.6)
Hemoglobin: 12.7 g/dL (ref 11.1–15.9)
MCH: 29.3 pg (ref 26.6–33.0)
MCHC: 33.1 g/dL (ref 31.5–35.7)
MCV: 89 fL (ref 79–97)
Platelets: 237 10*3/uL (ref 150–450)
RBC: 4.33 x10E6/uL (ref 3.77–5.28)
RDW: 13 % (ref 11.7–15.4)
WBC: 5.5 10*3/uL (ref 3.4–10.8)

## 2019-08-07 LAB — LIPID PANEL
Chol/HDL Ratio: 2.6 ratio (ref 0.0–4.4)
Cholesterol, Total: 168 mg/dL (ref 100–199)
HDL: 65 mg/dL (ref >39)
LDL Chol Calc (NIH): 94 mg/dL (ref 0–99)
Triglycerides: 45 mg/dL (ref 0–149)
VLDL Cholesterol Cal: 9 mg/dL (ref 5–40)

## 2019-08-07 LAB — TSH: TSH: 1.68 u[IU]/mL (ref 0.450–4.500)

## 2019-08-07 MED ORDER — METFORMIN HCL 500 MG PO TABS: 500.0000 mg | ORAL_TABLET | Freq: Two times a day (BID) | ORAL | 1 refills | Status: DC

## 2019-08-25 ENCOUNTER — Telehealth: Payer: Self-pay

## 2019-08-25 NOTE — Telephone Encounter (Signed)
Patient called stating she got a headache before going to bed last night her bp was 157/91 and this morning it was 135/68. She thinks her bp went up because she ate soy sauce with her [place] food. She took off of work today to make sure she was ok.  I returned her call and advised her to not eat soy sauce anymore and also to take magnesium otc per jm. YL,RMA

## 2019-10-10 ENCOUNTER — Encounter: Payer: Self-pay | Admitting: Nurse Practitioner

## 2019-10-13 ENCOUNTER — Other Ambulatory Visit: Payer: Self-pay

## 2019-10-13 DIAGNOSIS — R7303 Prediabetes: Secondary | ICD-10-CM

## 2019-10-13 MED ORDER — METFORMIN HCL 500 MG PO TABS: 500.0000 mg | ORAL_TABLET | Freq: Two times a day (BID) | ORAL | 1 refills | Status: DC

## 2019-10-14 ENCOUNTER — Other Ambulatory Visit: Payer: Self-pay | Admitting: Nurse Practitioner

## 2019-10-29 ENCOUNTER — Other Ambulatory Visit: Payer: Self-pay | Admitting: Nurse Practitioner

## 2019-10-29 DIAGNOSIS — E559 Vitamin D deficiency, unspecified: Secondary | ICD-10-CM

## 2019-11-04 NOTE — Progress Notes (Addendum)
This visit occurred during the SARS-CoV-2 public health emergency.  Safety protocols were in place, including screening questions prior to the visit, additional usage of staff PPE, and extensive cleaning of exam room while observing appropriate contact time as indicated for disinfecting solutions.  Subjective:     Patient ID: Lori Mcdaniel , female    DOB: [birth year] , 64 y.o.   MRN: [id number]   Chief Complaint  Patient presents with  . Prediabetes  . Hyperlipidemia  . thyroid check    HPI  Wt Readings from Last 3 Encounters: 11/06/19 : 184 lb 12.8 oz (83.8 kg) 08/06/19 : 192 lb 9.6 oz (87.4 kg) 02/05/19 : 198 lb 12.8 oz (90.2 kg)  She is trying to eat better and exercising in the morning.   Diabetes She presents for her follow-up diabetic visit. Diabetes type: prediabetes. There are no hypoglycemic associated symptoms. Pertinent negatives for hypoglycemia include no confusion, dizziness, headaches or nervousness/anxiousness. There are no diabetic associated symptoms. Pertinent negatives for diabetes include no chest pain, no fatigue and no weakness. There are no hypoglycemic complications. There are no diabetic complications. She is compliant with treatment all of the time. She is following a generally healthy diet. When asked about meal planning, she reported none. She participates in exercise every other day (she is exercising 2-3 days a week). An ACE inhibitor/angiotensin II receptor blocker is not being taken. She does not see a podiatrist.Eye exam is current.     Past Medical History:  Diagnosis Date  . GERD (gastroesophageal reflux disease)    years ago  . Thyroid mass of unclear etiology   . Type II diabetes mellitus (HCC)      Family History  Problem Relation Age of Onset  . Diabetes Mother   . Alzheimer's disease Mother   . Alzheimer's disease Father   . Stroke Father   . Breast cancer Neg Hx      Current Outpatient Medications:  .  atorvastatin (LIPITOR) 10  MG tablet, TAKE 1 TABLET BY MOUTH EVERY DAY, Disp: 90 tablet, Rfl: 0 .  metFORMIN (GLUCOPHAGE) 500 MG tablet, Take 1 tablet (500 mg total) by mouth 2 (two) times daily with a meal., Disp: 180 tablet, Rfl: 1 .  Vitamin D, Ergocalciferol, (DRISDOL) 1.25 MG (50000 UNIT) CAPS capsule, TAKE 1 CAPSULE BY MOUTH EVERY 7 DAYS, Disp: 12 capsule, Rfl: 0   Allergies  Allergen Reactions  . Penicillins Nausea And Vomiting    UNSPECIFIED REACTION  "Pt has had med in last 10 yrs - was ok to take" Has patient had a PCN reaction causing immediate rash, facial/tongue/throat swelling, SOB or lightheadedness with hypotension: no Has patient had a PCN reaction causing severe rash involving mucus membranes or skin necrosis: no Has patient had a PCN reaction that required hospitalization: no Has patient had a PCN reaction occurring within the last 10 years: no If all of the above answers are "NO", then may proceed with Cephalos     Review of Systems  Constitutional: Negative.  Negative for fatigue.  Eyes: Negative for visual disturbance.  Respiratory: Negative.  Negative for shortness of breath.   Cardiovascular: Negative.  Negative for chest pain, palpitations and leg swelling.  Gastrointestinal: Negative.   Endocrine: Negative.   Musculoskeletal: Negative.   Skin: Negative.   Neurological: Negative for dizziness, weakness and headaches.  Psychiatric/Behavioral: Negative for confusion. The patient is not nervous/anxious.      Today's Vitals   11/06/19 1559  BP: 116/70  Pulse: 99  Temp: 98 F (36.7 C)  TempSrc: Oral  Weight: 184 lb 12.8 oz (83.8 kg)  Height: 5' 4.6" (1.641 m)  PainSc: 0-No pain   Body mass index is 31.13 kg/m.   Objective:  Physical Exam Constitutional:      General: She is not in acute distress.    Appearance: Normal appearance. She is obese.  Cardiovascular:     Rate and Rhythm: Normal rate and regular rhythm.     Pulses: Normal pulses.     Heart sounds: Normal heart  sounds. No murmur heard.   Pulmonary:     Effort: Pulmonary effort is normal.     Breath sounds: Normal breath sounds.  Skin:    General: Skin is warm and dry.     Comments: Slight keloid scar to mid neck from thyroid surgery  Neurological:     General: No focal deficit present.     Mental Status: She is alert and oriented to person, place, and time.     Cranial Nerves: No cranial nerve deficit.  Psychiatric:        Mood and Affect: Mood normal.        Behavior: Behavior normal.        Thought Content: Thought content normal.        Judgment: Judgment normal.         Assessment And Plan:  1. Prediabetes  Chronic, doing well with metformin  Continue exercising regularly and eating healthy diet - Hemoglobin A1c - CMP14+EGFR  2. Hyperlipidemia, unspecified hyperlipidemia type  Chronic, stable  Continue with atorvastatin and tolerating well  Continue avoiding fried and fatty foods. - Lipid panel - CMP14+EGFR   Patient was given opportunity to ask questions. Patient verbalized understanding of the plan and was able to repeat key elements of the plan. All questions were answered to their satisfaction.  [name], FNP   I, [name], FNP, have reviewed all documentation for this visit. The documentation on 11/06/19 for the exam, diagnosis, procedures, and orders are all accurate and complete.  THE PATIENT IS ENCOURAGED TO PRACTICE SOCIAL DISTANCING DUE TO THE COVID-19 PANDEMIC.

## 2019-11-06 ENCOUNTER — Ambulatory Visit: Payer: PRIVATE HEALTH INSURANCE | Admitting: Nurse Practitioner

## 2019-11-06 ENCOUNTER — Encounter: Payer: Self-pay | Admitting: Nurse Practitioner

## 2019-11-06 ENCOUNTER — Other Ambulatory Visit: Payer: Self-pay

## 2019-11-06 VITALS — BP 116/70 | HR 99 | Temp 98.0°F | Ht 64.6 in | Wt 184.8 lb

## 2019-11-06 DIAGNOSIS — E785 Hyperlipidemia, unspecified: Secondary | ICD-10-CM | POA: Diagnosis not present

## 2019-11-06 DIAGNOSIS — R7303 Prediabetes: Secondary | ICD-10-CM

## 2019-11-07 LAB — CMP14+EGFR
ALT: 17 IU/L (ref 0–32)
AST: 16 IU/L (ref 0–40)
Albumin/Globulin Ratio: 1.6 (ref 1.2–2.2)
Albumin: 4.2 g/dL (ref 3.8–4.8)
Alkaline Phosphatase: 94 IU/L (ref 48–121)
BUN/Creatinine Ratio: 17 (ref 12–28)
BUN: 16 mg/dL (ref 8–27)
Bilirubin Total: 0.3 mg/dL (ref 0.0–1.2)
CO2: 24 mmol/L (ref 20–29)
Calcium: 9.6 mg/dL (ref 8.7–10.3)
Chloride: 105 mmol/L (ref 96–106)
Creatinine, Ser: 0.92 mg/dL (ref 0.57–1.00)
GFR calc Af Amer: 78 mL/min/{1.73_m2} (ref >59)
GFR calc non Af Amer: 67 mL/min/{1.73_m2} (ref >59)
Globulin, Total: 2.6 g/dL (ref 1.5–4.5)
Glucose: 85 mg/dL (ref 65–99)
Potassium: 4.2 mmol/L (ref 3.5–5.2)
Sodium: 141 mmol/L (ref 134–144)
Total Protein: 6.8 g/dL (ref 6.0–8.5)

## 2019-11-07 LAB — LIPID PANEL
Chol/HDL Ratio: 2.3 ratio (ref 0.0–4.4)
Cholesterol, Total: 162 mg/dL (ref 100–199)
HDL: 69 mg/dL (ref >39)
LDL Chol Calc (NIH): 84 mg/dL (ref 0–99)
Triglycerides: 40 mg/dL (ref 0–149)
VLDL Cholesterol Cal: 9 mg/dL (ref 5–40)

## 2019-11-07 LAB — HEMOGLOBIN A1C
Est. average glucose Bld gHb Est-mCnc: 123 mg/dL
Hgb A1c MFr Bld: 5.9 % — ABNORMAL HIGH (ref 4.8–5.6)

## 2020-01-12 ENCOUNTER — Other Ambulatory Visit: Payer: Self-pay | Admitting: Nurse Practitioner

## 2020-02-09 ENCOUNTER — Ambulatory Visit: Payer: PRIVATE HEALTH INSURANCE | Admitting: Nurse Practitioner

## 2020-02-09 ENCOUNTER — Encounter: Payer: Self-pay | Admitting: Nurse Practitioner

## 2020-02-09 ENCOUNTER — Other Ambulatory Visit: Payer: Self-pay

## 2020-02-09 VITALS — BP 138/66 | HR 79 | Temp 98.5°F | Ht 64.6 in | Wt 181.4 lb

## 2020-02-09 DIAGNOSIS — Z23 Encounter for immunization: Secondary | ICD-10-CM | POA: Diagnosis not present

## 2020-02-09 DIAGNOSIS — Z6830 Body mass index (BMI) 30.0-30.9, adult: Secondary | ICD-10-CM

## 2020-02-09 DIAGNOSIS — Z0000 Encounter for general adult medical examination without abnormal findings: Secondary | ICD-10-CM

## 2020-02-09 DIAGNOSIS — E119 Type 2 diabetes mellitus without complications: Secondary | ICD-10-CM

## 2020-02-09 DIAGNOSIS — E785 Hyperlipidemia, unspecified: Secondary | ICD-10-CM

## 2020-02-09 LAB — POCT UA - MICROALBUMIN
Albumin/Creatinine Ratio, Urine, POC: 30
Creatinine, POC: 200 mg/dL
Microalbumin Ur, POC: 10 mg/L

## 2020-02-09 LAB — POCT URINALYSIS DIPSTICK
Bilirubin, UA: NEGATIVE
Blood, UA: NEGATIVE
Glucose, UA: NEGATIVE
Ketones, UA: NEGATIVE
Leukocytes, UA: NEGATIVE
Nitrite, UA: NEGATIVE
Protein, UA: NEGATIVE
Spec Grav, UA: >=1.03 — AB (ref 1.010–1.025)
Urobilinogen, UA: 0.2 E.U./dL
pH, UA: 6 (ref 5.0–8.0)

## 2020-02-09 NOTE — Progress Notes (Signed)
I,Yamilka Roman [employer] as a Education administrator for [employer], FNP.,have documented all relevant documentation on the behalf of [name], FNP,as directed by  [name], FNP while in the presence of [name], Craig. This visit occurred during the SARS-CoV-2 public health emergency.  Safety protocols were in place, including screening questions prior to the visit, additional usage of staff PPE, and extensive cleaning of exam room while observing appropriate contact time as indicated for disinfecting solutions.  Subjective:     Patient ID: Lori Mcdaniel , female    DOB: Jan 01, 1956 , 64 y.o.   MRN: [id number]   Chief Complaint  Patient presents with  . Annual Exam    HPI  Patient here for her HM. She will schedule an appt with ophthalmologist and podiatrist (she is having pain to the back of her left heel).    Wt Readings from Last 3 Encounters: 02/09/20 : 181 lb 6.4 oz (82.3 kg) 11/06/19 : 184 lb 12.8 oz (83.8 kg) 08/06/19 : 192 lb 9.6 oz (87.4 kg)  She reports people are noticing she is losing weight.      Past Medical History:  Diagnosis Date  . GERD (gastroesophageal reflux disease)    years ago  . Thyroid mass of unclear etiology   . Type II diabetes mellitus (HCC)      Family History  Problem Relation Age of Onset  . Diabetes Mother   . Alzheimer's disease Mother   . Alzheimer's disease Father   . Stroke Father   . Breast cancer Neg Hx      Current Outpatient Medications:  .  atorvastatin (LIPITOR) 10 MG tablet, TAKE 1 TABLET BY MOUTH EVERY DAY, Disp: 90 tablet, Rfl: 0 .  metFORMIN (GLUCOPHAGE) 500 MG tablet, Take 1 tablet (500 mg total) by mouth 2 (two) times daily with a meal., Disp: 180 tablet, Rfl: 1 .  Vitamin D, Ergocalciferol, (DRISDOL) 1.25 MG (50000 UNIT) CAPS capsule, TAKE 1 CAPSULE BY MOUTH EVERY 7 DAYS, Disp: 12 capsule, Rfl: 0   Allergies  Allergen Reactions  . Penicillins Nausea And Vomiting    UNSPECIFIED REACTION  "Pt has had med in last  10 yrs - was ok to take" Has patient had a PCN reaction causing immediate rash, facial/tongue/throat swelling, SOB or lightheadedness with hypotension: no Has patient had a PCN reaction causing severe rash involving mucus membranes or skin necrosis: no Has patient had a PCN reaction that required hospitalization: no Has patient had a PCN reaction occurring within the last 10 years: no If all of the above answers are "NO", then may proceed with Cephalos      The patient states she is post menopausal status. Negative for Dysmenorrhea and Negative for Menorrhagia. Negative for: breast discharge, breast lump(s), breast pain and breast self exam. Associated symptoms include abnormal vaginal bleeding. Pertinent negatives include abnormal bleeding (hematology), anxiety, decreased libido, depression, difficulty falling sleep, dyspareunia, history of infertility, nocturia, sexual dysfunction, sleep disturbances, urinary incontinence, urinary urgency, vaginal discharge and vaginal itching. Diet regular. The patient states her exercise level is moderate - she is bowling 2 times a week and walking at work.      . The patient's tobacco use is:  Social History   Tobacco Use  Smoking Status Never Smoker  Smokeless Tobacco Never Used   She has been exposed to passive smoke. The patient's alcohol use is:  Social History   Substance and Sexual Activity  Alcohol Use No   Additional information: Last pap 02/05/2019,  next one scheduled for 02/04/2022    Review of Systems  Constitutional: Negative.   HENT: Negative.   Eyes: Negative.   Respiratory: Negative.  Negative for cough.   Cardiovascular: Negative.  Negative for chest pain, palpitations and leg swelling.  Gastrointestinal: Negative.   Endocrine: Negative.   Genitourinary: Negative.   Musculoskeletal: Negative.        Back of left heel  Skin: Negative.   Allergic/Immunologic: Negative.   Neurological: Negative.   Hematological: Negative.    Psychiatric/Behavioral: Negative.      Today's Vitals   02/09/20 1525  BP: 138/66  Pulse: 79  Temp: 98.5 F (36.9 C)  TempSrc: Oral  Weight: 181 lb 6.4 oz (82.3 kg)  Height: 5' 4.6" (1.641 m)  PainSc: 0-No pain   Body mass index is 30.56 kg/m.   Objective:  Physical Exam Vitals reviewed.  Constitutional:      General: She is not in acute distress.    Appearance: Normal appearance. She is well-developed. She is obese.  HENT:     Head: Normocephalic and atraumatic.     Right Ear: Hearing, tympanic membrane, ear canal and external ear normal. There is no impacted cerumen.     Left Ear: Hearing, tympanic membrane, ear canal and external ear normal. There is no impacted cerumen.     Nose:     Comments: Deferred - masked    Mouth/Throat:     Comments: Deferred - masked Eyes:     General: Lids are normal.     Extraocular Movements: Extraocular movements intact.     Conjunctiva/sclera: Conjunctivae normal.     Pupils: Pupils are equal, round, and reactive to light.     Funduscopic exam:    Right eye: No papilledema.        Left eye: No papilledema.  Neck:     Thyroid: No thyroid mass.     Vascular: No carotid bruit.  Cardiovascular:     Rate and Rhythm: Normal rate and regular rhythm.     Pulses: Normal pulses.     Heart sounds: Normal heart sounds. No murmur heard.   Pulmonary:     Effort: Pulmonary effort is normal. No respiratory distress.     Breath sounds: Normal breath sounds. No wheezing.  Chest:     Chest wall: No mass.     Breasts: Tanner Score is 5.        Right: Normal. No mass or tenderness.        Left: Normal. No mass or tenderness.  Abdominal:     General: Abdomen is flat. Bowel sounds are normal. There is no distension.     Palpations: Abdomen is soft.     Tenderness: There is no abdominal tenderness.  Genitourinary:    Rectum: Guaiac result negative.  Musculoskeletal:        General: No swelling. Normal range of motion.     Cervical back: Full  passive range of motion without pain, normal range of motion and neck supple.     Right lower leg: No edema.     Left lower leg: No edema.  Lymphadenopathy:     Upper Body:     Right upper body: No supraclavicular, axillary or pectoral adenopathy.     Left upper body: No supraclavicular, axillary or pectoral adenopathy.  Skin:    General: Skin is warm and dry.     Capillary Refill: Capillary refill takes less than 2 seconds.  Neurological:     General: No  focal deficit present.     Mental Status: She is alert and oriented to person, place, and time.     Cranial Nerves: No cranial nerve deficit.     Sensory: No sensory deficit.  Psychiatric:        Mood and Affect: Mood normal.        Behavior: Behavior normal.        Thought Content: Thought content normal.        Judgment: Judgment normal.         Assessment And Plan:     1. Encounter for health maintenance examination . Behavior modifications discussed and diet history reviewed.   . Pt will continue to exercise regularly and modify diet with low GI, plant based foods and decrease intake of processed foods.  . Recommend intake of daily multivitamin, Vitamin D, and calcium.  . Recommend for preventive screenings, as well as recommend immunizations that include influenza, TDAP (up to date) - CBC  2. BMI 30.0-30.9,adult   Chronic  Discussed healthy diet and regular exercise options   Encouraged to exercise at least 150 minutes per week with 2 days of strength training  She has lost approximately 11 lbs since April Wt Readings from Last 3 Encounters:  02/09/20 181 lb 6.4 oz (82.3 kg)  11/06/19 184 lb 12.8 oz (83.8 kg)  08/06/19 192 lb 9.6 oz (87.4 kg)     3. Type 2 diabetes mellitus without complication, without long-term current use of insulin (HCC)  Chronic, she is in prediabetic range currently  Tolerating metformin well  Diabetic foot exam done with normal findings  She is to schedule an appt with her eye  doctor - POCT Urinalysis Dipstick (81002) - POCT UA - Microalbumin - Hemoglobin A1c - Lipid panel  4. Hyperlipidemia, unspecified hyperlipidemia type  Chronic, controlled  Continue with current medications, tolerating statin well  Continue with low fat diet - CMP14+EGFR - Lipid panel  5. Need for influenza vaccination  Influenza vaccine administered  Encouraged to take Tylenol as needed for fever or muscle aches. - Flu Vaccine QUAD 6+ mos PF IM (Fluarix Quad PF)    Patient was given opportunity to ask questions. Patient verbalized understanding of the plan and was able to repeat key elements of the plan. All questions were answered to their satisfaction.   Teola Bradley, FNP, have reviewed all documentation for this visit. The documentation on 02/15/20 for the exam, diagnosis, procedures, and orders are all accurate and complete.  THE PATIENT IS ENCOURAGED TO PRACTICE SOCIAL DISTANCING DUE TO THE COVID-19 PANDEMIC.

## 2020-02-09 NOTE — Patient Instructions (Addendum)
Health Maintenance, Female Adopting a healthy lifestyle and getting preventive care are important in promoting health and wellness. Ask your health care provider about:  The right schedule for you to have regular tests and exams.  Things you can do on your own to prevent diseases and keep yourself healthy. What should I know about diet, weight, and exercise? Eat a healthy diet   Eat a diet that includes plenty of vegetables, fruits, low-fat dairy products, and lean protein.  Do not eat a lot of foods that are high in solid fats, added sugars, or sodium. Maintain a healthy weight Body mass index (BMI) is used to identify weight problems. It estimates body fat based on height and weight. Your health care provider can help determine your BMI and help you achieve or maintain a healthy weight. Get regular exercise Get regular exercise. This is one of the most important things you can do for your health. Most adults should:  Exercise for at least 150 minutes each week. The exercise should increase your heart rate and make you sweat (moderate-intensity exercise).  Do strengthening exercises at least twice a week. This is in addition to the moderate-intensity exercise.  Spend less time sitting. Even light physical activity can be beneficial. Watch cholesterol and blood lipids Have your blood tested for lipids and cholesterol at 62 years of age, then have this test every 5 years. Have your cholesterol levels checked more often if:  Your lipid or cholesterol levels are high.  You are older than 62 years of age.  You are at high risk for heart disease. What should I know about cancer screening? Depending on your health history and family history, you may need to have cancer screening at various ages. This may include screening for:  Breast cancer.  Cervical cancer.  Colorectal cancer.  Skin cancer.  Lung cancer. What should I know about heart disease, diabetes, and high blood  pressure? Blood pressure and heart disease  High blood pressure causes heart disease and increases the risk of stroke. This is more likely to develop in people who have high blood pressure readings, are of African descent, or are overweight.  Have your blood pressure checked: ? Every 3-5 years if you are 18-39 years of age. ? Every year if you are 40 years old or older. Diabetes Have regular diabetes screenings. This checks your fasting blood sugar level. Have the screening done:  Once every three years after age 40 if you are at a normal weight and have a low risk for diabetes.  More often and at a younger age if you are overweight or have a high risk for diabetes. What should I know about preventing infection? Hepatitis B If you have a higher risk for hepatitis B, you should be screened for this virus. Talk with your health care provider to find out if you are at risk for hepatitis B infection. Hepatitis C Testing is recommended for:  Everyone born from 1945 through 1965.  Anyone with known risk factors for hepatitis C. Sexually transmitted infections (STIs)  Get screened for STIs, including gonorrhea and chlamydia, if: ? You are sexually active and are younger than 62 years of age. ? You are older than 62 years of age and your health care provider tells you that you are at risk for this type of infection. ? Your sexual activity has changed since you were last screened, and you are at increased risk for chlamydia or gonorrhea. Ask your health care provider if   you are at risk.  Ask your health care provider about whether you are at high risk for HIV. Your health care provider may recommend a prescription medicine to help prevent HIV infection. If you choose to take medicine to prevent HIV, you should first get tested for HIV. You should then be tested every 3 months for as long as you are taking the medicine. Pregnancy  If you are about to stop having your period (premenopausal) and  you may become pregnant, seek counseling before you get pregnant.  Take 400 to 800 micrograms (mcg) of folic acid every day if you become pregnant.  Ask for birth control (contraception) if you want to prevent pregnancy. Osteoporosis and menopause Osteoporosis is a disease in which the bones lose minerals and strength with aging. This can result in bone fractures. If you are 88 years old or older, or if you are at risk for osteoporosis and fractures, ask your health care provider if you should:  Be screened for bone loss.  Take a calcium or vitamin D supplement to lower your risk of fractures.  Be given hormone replacement therapy (HRT) to treat symptoms of menopause. Follow these instructions at home: Lifestyle  Do not use any products that contain nicotine or tobacco, such as cigarettes, e-cigarettes, and chewing tobacco. If you need help quitting, ask your health care provider.  Do not use street drugs.  Do not share needles.  Ask your health care provider for help if you need support or information about quitting drugs. Alcohol use  Do not drink alcohol if: ? Your health care provider tells you not to drink. ? You are pregnant, may be pregnant, or are planning to become pregnant.  If you drink alcohol: ? Limit how much you use to 0-1 drink a day. ? Limit intake if you are breastfeeding.  Be aware of how much alcohol is in your drink. In the [place] one drink equals one 12 oz bottle of beer (355 mL), one 5 oz glass of wine (148 mL), or one 1 oz glass of hard liquor (44 mL). General instructions  Schedule regular health, dental, and eye exams.  Stay current with your vaccines.  Tell your health care provider if: ? You often feel depressed. ? You have ever been abused or do not feel safe at home. Summary  Adopting a healthy lifestyle and getting preventive care are important in promoting health and wellness.  Follow your health care provider's instructions about healthy  diet, exercising, and getting tested or screened for diseases.  Follow your health care provider's instructions on monitoring your cholesterol and blood pressure. This information is not intended to replace advice given to you by your health care provider. Make sure you discuss any questions you have with your health care provider. Document Revised: 04/03/2018 Document Reviewed: 04/03/2018 Elsevier Patient Education  [place].    Achilles Tendinitis Rehab Ask your health care provider which exercises are safe for you. Do exercises exactly as told by your health care provider and adjust them as directed. It is normal to feel mild stretching, pulling, tightness, or discomfort as you do these exercises. Stop right away if you feel sudden pain or your pain gets worse. Do not begin these exercises until told by your health care provider. Stretching and range-of-motion exercises These exercises warm up your muscles and joints and improve the movement and flexibility of your ankle. These exercises also help to relieve pain. Standing wall calf stretch with straight knee  1. Stand with your hands against a wall. 2. Extend your left / right leg behind you, and bend your front knee slightly. Keep both of your heels on the floor. 3. Point the toes of your back foot slightly inward. 4. Keeping your heels on the floor and your back knee straight, shift your weight toward the wall. Do not allow your back to arch. You should feel a gentle stretch in your upper calf. 5. Hold this position for __________ seconds. Repeat __________ times. Complete this exercise __________ times a day. Standing wall calf stretch with bent knee 1. Stand with your hands against a wall. 2. Extend your left / right leg behind you, and bend your front knee slightly. Keep both of your heels on the floor. 3. Point the toes of your back foot slightly inward. 4. Keeping your heels on the floor, bend your back knee slightly.  You should feel a gentle stretch deep in your lower calf near your heel. 5. Hold this position for __________ seconds. Repeat __________ times. Complete this exercise __________ times a day. Strengthening exercises These exercises build strength and control of your ankle. Endurance is the ability to use your muscles for a long time, even after they get tired. Plantar flexion with band In this exercise, you push your toes downward, away from you, with an exercise band providing resistance. 1. Sit on the floor with your left / right leg extended. You may put a pillow under your calf to give your foot more room to move. 2. Loop a rubber exercise band or tube around the ball of your left / right foot. The ball of your foot is on the walking surface, right under your toes. The band or tube should be slightly tense when your foot is relaxed. If the band or tube slips, you can put on your shoe or put a washcloth between the band and your foot to help it stay in place. 3. Slowly point your toes downward, pushing them away from you (plantar flexion). 4. Hold this position for __________ seconds. 5. Slowly release the tension in the band or tube, controlling smoothly until your foot is back to the starting position. 6. Repeat steps 1-5 with your left / right leg. Repeat __________ times. Complete this exercise __________ times a day. Eccentric heel drop  In this exercise, you stand and slowly raise your heel and then slowly lower it. This exercise lengthens the calf muscles (eccentric) while the heel bears weight. If this exercise is too easy, try doing it while wearing a backpack with weights in it. 1. Stand on a step with the balls of your feet. The ball of your foot is on the walking surface, right under your toes. ? Do not put your heels on the step. ? For balance, rest your hands on the wall or on a railing. 2. Rise up onto the balls of your feet. 3. Keeping your heels up, shift all of your weight  to your left / right leg and pick up your other leg. 4. Slowly lower your left / right leg so your heel drops below the level of the step. 5. Put down your other foot before returning to the start position. If told by your health care provider, build up to: ? 3 sets of 15 repetitions while keeping your knees straight. ? 3 sets of 15 repetitions while keeping your knees slightly bent as far as told by your health care provider. Repeat __________ times. Complete this exercise  __________ times a day. Balance exercises These exercises improve or maintain your balance. Balance is important in preventing falls. Single leg stand If this exercise is too easy, you can try it with your eyes closed or while standing on a pillow. 1. Without shoes, stand near a railing or in a door frame. Hold on to the railing or door frame as needed. 2. Stand on your left / right foot. Keep your big toe down on the floor and try to keep your arch lifted. 3. Hold this position for __________ seconds. Repeat __________ times. Complete this exercise __________ times a day. This information is not intended to replace advice given to you by your health care provider. Make sure you discuss any questions you have with your health care provider. Document Revised: 07/29/2018 Document Reviewed: 01/21/2018 Elsevier Patient Education  [place].  [employer]osen's Emergency Medicine: Concepts and Clinical Practice (9th ed., pp. [phone]). [place], [place]: [place]. Retrieved from https://www.clinicalkey.com/#!/content/[email or web address]-[email or web address]">  Achilles Tendinitis  Achilles tendinitis is inflammation of the tough, cord-like band that attaches the lower leg muscles to the heel bone (Achilles tendon). This is usually caused by overusing the tendon and the ankle joint. Achilles tendinitis usually gets better over time with treatment and caring for yourself at home. It can take weeks or months to heal  completely. What are the causes? This condition may be caused by:  A sudden increase in exercise or activity, such as running.  Doing the same exercises or activities, such as jumping, over and over.  Not warming up calf muscles before exercising.  Exercising in shoes that are worn out or not made for exercise.  Having arthritis or a bone growth (spur) on the back of the heel bone. This can rub against the tendon and hurt it.  Age-related wear and tear. Tendons become less flexible with age and are more likely to be injured. What are the signs or symptoms? Common symptoms of this condition include:  Pain in the Achilles tendon or in the back of the leg, just above the heel. The pain usually gets worse with exercise.  Stiffness or soreness in the back of the leg, especially in the morning.  Swelling of the skin over the Achilles tendon.  Thickening of the tendon.  Trouble standing on tiptoe. How is this diagnosed? This condition is diagnosed based on your symptoms and a physical exam. You may have tests, including:  X-rays.  MRI. How is this treated? The goal of treatment is to relieve symptoms and help your injury heal. Treatment may include:  Decreasing or stopping activities that caused the tendinitis. This may mean switching to low-impact exercises like biking or swimming.  Icing the injured area.  Doing physical therapy, including strengthening and stretching exercises.  Taking NSAIDs, such as ibuprofen, to help relieve pain and swelling.  Using supportive shoes, wraps, heel lifts, or a walking boot (air cast).  Having surgery. This may be done if your symptoms do not improve after other treatments.  Using high-energy shock wave impulses to stimulate the healing process (extracorporeal shock wave therapy). This is rare.  Having an injection of medicines that help relieve inflammation (corticosteroids). This is rare. Follow these instructions at home: If you  have an air cast:  Wear the air cast as told by your health care provider. Remove it only as told by your health care provider.  Loosen it if your toes tingle, become numb, or turn cold and blue.  Keep it clean.  If the  air cast is not waterproof: ? Do not let it get wet. ? Cover it with a watertight covering when you take a bath or shower. Managing pain, stiffness, and swelling   If directed, put ice on the injured area. To do this: ? If you have a removable air cast, remove it as told by your health care provider. ? Put ice in a plastic bag. ? Place a towel between your skin and the bag. ? Leave the ice on for 20 minutes, 2-3 times a day.  Move your toes often to reduce stiffness and swelling.  Raise (elevate) your foot above the level of your heart while you are sitting or lying down. Activity  Gradually return to your normal activities as told by your health care provider. Ask your health care provider what activities are safe for you.  Do not do activities that cause pain.  Consider doing low-impact exercises, like cycling or swimming.  Ask your health care provider when it is safe to drive if you have an air cast on your foot.  If physical therapy was prescribed, do exercises as told by your health care provider or physical therapist. General instructions  If directed, wrap your foot with an elastic bandage or other wrap. This can help to keep your tendon from moving too much while it heals. Your health care provider will show you how to wrap your foot correctly.  Wear supportive shoes or heel lifts only as told by your health care provider.  Take over-the-counter and prescription medicines only as told by your health care provider.  Keep all follow-up visits as told by your health care provider. This is important. Contact a health care provider if you:  Have symptoms that get worse.  Have pain that does not get better with medicine.  Develop new, unexplained  symptoms.  Develop warmth and swelling in your foot.  Have a fever. Get help right away if you:  Have a sudden popping sound or sensation in your Achilles tendon followed by severe pain.  Cannot move your toes or foot.  Cannot put any weight on your foot.  Your foot or toes become numb and look white or blue even after loosening your bandage or air cast. Summary  Achilles tendinitis is inflammation of the tough, cord-like band that attaches the lower leg muscles to the heel bone (Achilles tendon).  This condition is usually caused by overusing the tendon and the ankle joint. It can also be caused by arthritis or normal aging.  The most common symptoms of this condition include pain, swelling, or stiffness in the Achilles tendon or in the back of the leg.  This condition is usually treated by decreasing or stopping activities that caused the tendinitis, icing the injured area, taking NSAIDs, and doing physical therapy. This information is not intended to replace advice given to you by your health care provider. Make sure you discuss any questions you have with your health care provider. Document Revised: 08/26/2018 Document Reviewed: 08/26/2018 Elsevier Patient Education  [place].

## 2020-02-10 LAB — CMP14+EGFR
ALT: 16 IU/L (ref 0–32)
AST: 15 IU/L (ref 0–40)
Albumin/Globulin Ratio: 1.8 (ref 1.2–2.2)
Albumin: 4.4 g/dL (ref 3.8–4.8)
Alkaline Phosphatase: 93 IU/L (ref 44–121)
BUN/Creatinine Ratio: 16 (ref 12–28)
BUN: 13 mg/dL (ref 8–27)
Bilirubin Total: 0.2 mg/dL (ref 0.0–1.2)
CO2: 26 mmol/L (ref 20–29)
Calcium: 9.4 mg/dL (ref 8.7–10.3)
Chloride: 103 mmol/L (ref 96–106)
Creatinine, Ser: 0.83 mg/dL (ref 0.57–1.00)
GFR calc Af Amer: 88 mL/min/{1.73_m2} (ref >59)
GFR calc non Af Amer: 76 mL/min/{1.73_m2} (ref >59)
Globulin, Total: 2.4 g/dL (ref 1.5–4.5)
Glucose: 85 mg/dL (ref 65–99)
Potassium: 3.9 mmol/L (ref 3.5–5.2)
Sodium: 141 mmol/L (ref 134–144)
Total Protein: 6.8 g/dL (ref 6.0–8.5)

## 2020-02-10 LAB — CBC
Hematocrit: 39.1 % (ref 34.0–46.6)
Hemoglobin: 12.8 g/dL (ref 11.1–15.9)
MCH: 28.8 pg (ref 26.6–33.0)
MCHC: 32.7 g/dL (ref 31.5–35.7)
MCV: 88 fL (ref 79–97)
Platelets: 242 10*3/uL (ref 150–450)
RBC: 4.44 x10E6/uL (ref 3.77–5.28)
RDW: 13.3 % (ref 11.7–15.4)
WBC: 5.9 10*3/uL (ref 3.4–10.8)

## 2020-02-10 LAB — LIPID PANEL
Chol/HDL Ratio: 2.8 ratio (ref 0.0–4.4)
Cholesterol, Total: 188 mg/dL (ref 100–199)
HDL: 68 mg/dL (ref >39)
LDL Chol Calc (NIH): 109 mg/dL — ABNORMAL HIGH (ref 0–99)
Triglycerides: 57 mg/dL (ref 0–149)
VLDL Cholesterol Cal: 11 mg/dL (ref 5–40)

## 2020-02-10 LAB — HEMOGLOBIN A1C
Est. average glucose Bld gHb Est-mCnc: 123 mg/dL
Hgb A1c MFr Bld: 5.9 % — ABNORMAL HIGH (ref 4.8–5.6)

## 2020-02-11 ENCOUNTER — Telehealth: Payer: Self-pay

## 2020-02-11 NOTE — Telephone Encounter (Signed)
I left a message that the pt's biometric screening form has been completed, faxed, and a copy has been kept for the pt's chart and also emailed to the pt's email on file.

## 2020-02-15 ENCOUNTER — Encounter: Payer: Self-pay | Admitting: Nurse Practitioner

## 2020-03-03 ENCOUNTER — Encounter: Payer: Self-pay | Admitting: Podiatry

## 2020-03-03 ENCOUNTER — Ambulatory Visit (INDEPENDENT_AMBULATORY_CARE_PROVIDER_SITE_OTHER): Payer: PRIVATE HEALTH INSURANCE | Admitting: Podiatry

## 2020-03-03 ENCOUNTER — Other Ambulatory Visit: Payer: Self-pay

## 2020-03-03 DIAGNOSIS — M2010 Hallux valgus (acquired), unspecified foot: Secondary | ICD-10-CM

## 2020-03-03 DIAGNOSIS — M7752 Other enthesopathy of left foot: Secondary | ICD-10-CM | POA: Diagnosis not present

## 2020-03-03 DIAGNOSIS — E119 Type 2 diabetes mellitus without complications: Secondary | ICD-10-CM

## 2020-03-03 MED ORDER — MELOXICAM 15 MG PO TABS: 15.0000 mg | ORAL_TABLET | Freq: Every day | ORAL | 0 refills | Status: DC

## 2020-03-03 MED ORDER — MELOXICAM 15 MG PO TABS
15.0000 mg | ORAL_TABLET | Freq: Every day | ORAL | 1 refills | Status: DC
Start: 2020-03-03 — End: 2020-04-05

## 2020-03-03 NOTE — Progress Notes (Signed)
This patient presents to the office for an evaluation of her diabetic feet.  She presents the office stating she is not having any foot problems today.  She has pain on the back of her left heel at the insertion of achilles tendon.    She presents to the office today per her doctor for an evaluation and check of her diabetic feet.  Patient requests evaluation of her painful heel.  General Appearance  Alert, conversant and in no acute stress.  Vascular  Dorsalis pedis and posterior tibial  pulses are palpable  bilaterally.  Capillary return is within normal limits  bilaterally. Temperature is within normal limits  bilaterally.  Neurologic  Senn-Weinstein monofilament wire test within normal limits  bilaterally. Muscle power within normal limits bilaterally.  Nails normal nails with no evidence of any thick disfigured discoloration.  No evidence of any drainage or infection to the nails noted.  Orthopedic  No limitations of motion of motion feet .  No crepitus or effusions noted.  No bony pathology or digital deformities noted. 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 Palpable pain at the insertion achilles tendon left foot.  Skin  normotropic skin with no porokeratosis noted bilaterally.  No signs of infections or ulcers noted.         Diabetes with no foot complications  Retrocalcaneal bursitis left foot    A diabetic foot exam was performed and there is no evidence of any vascular or neurologic pathology.  Prescribed Mobic for her heel pain.  Patient was told to make an appointment with medical podiatrists if pain persists.    RTC 1 year.Gardiner Barefoot DPM

## 2020-03-23 LAB — HM DIABETES EYE EXAM

## 2020-04-04 ENCOUNTER — Other Ambulatory Visit: Payer: Self-pay | Admitting: Podiatry

## 2020-04-11 ENCOUNTER — Other Ambulatory Visit: Payer: Self-pay | Admitting: Nurse Practitioner

## 2020-05-01 IMAGING — US US THYROID
1 series · 13 of 25 positions shown · non-contrast
Comparison: 02/08/2018; 02/12/2017

CLINICAL DATA: Prior ultrasound follow-up. History of right-sided
thyroid lobectomy. Follow-up thyroid nodules.

EXAM:
THYROID ULTRASOUND
TECHNIQUE: Ultrasound examination of the thyroid gland and adjacent soft
tissues was performed.

[Series 1: us thyroid · 0.06mm/px · 13 of 29 slices shown]
[im 1/29]
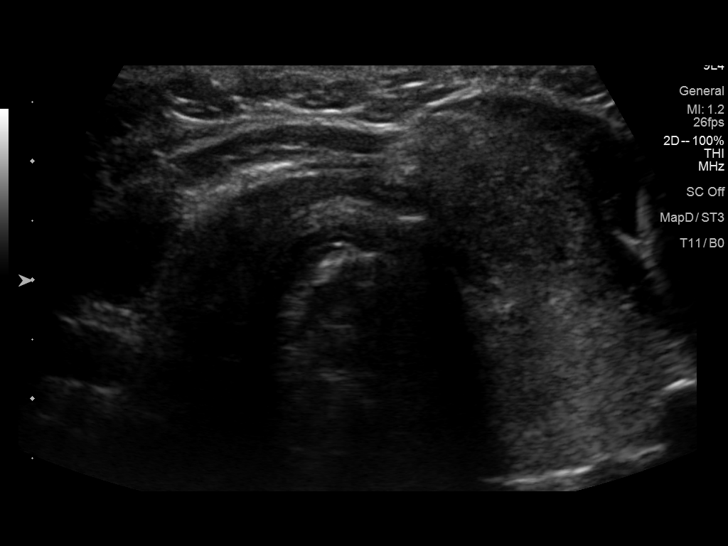
[im 3/29]
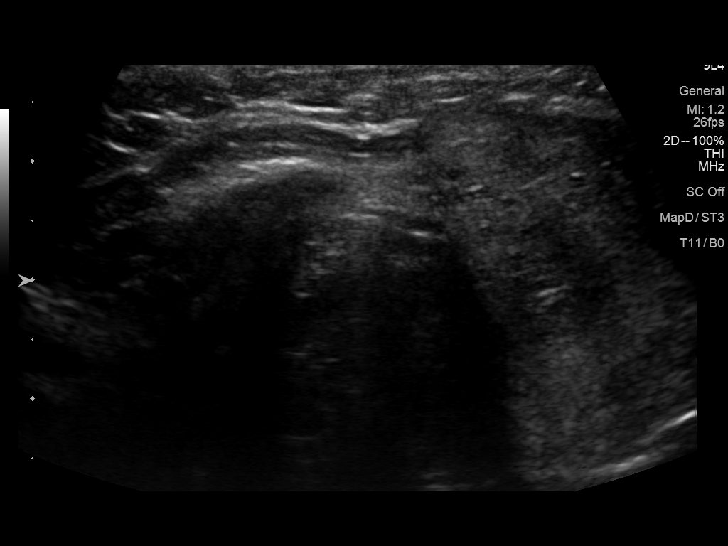
[im 5/29]
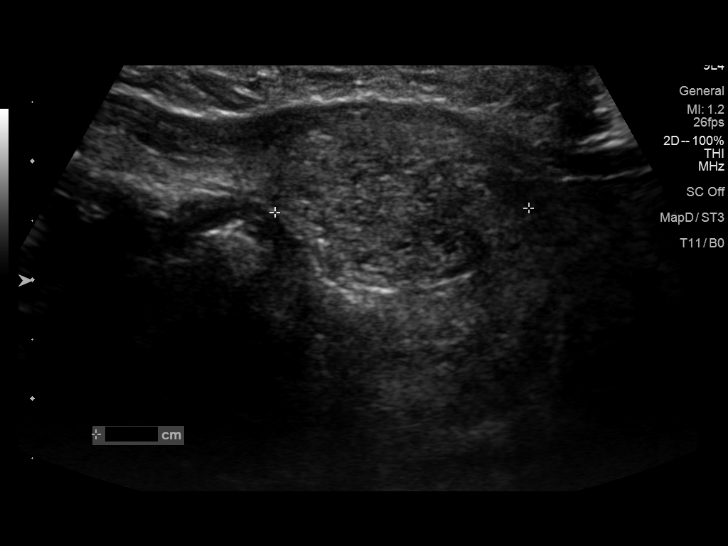
[im 8/29]
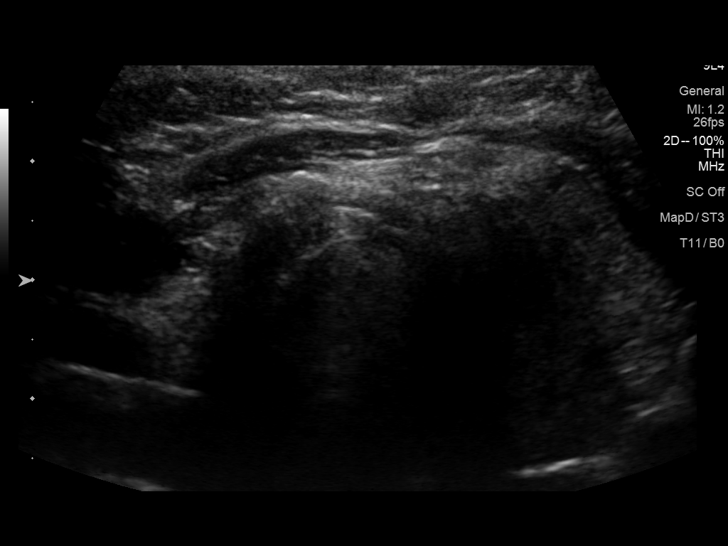
[im 10/29]
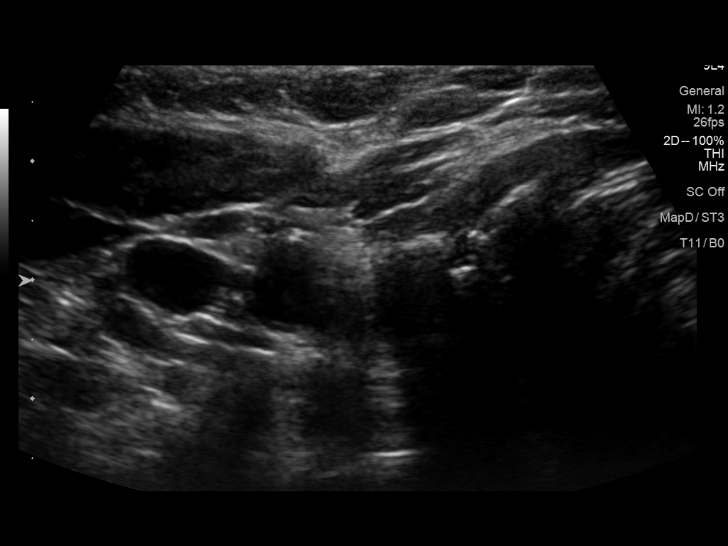
[im 12/29]
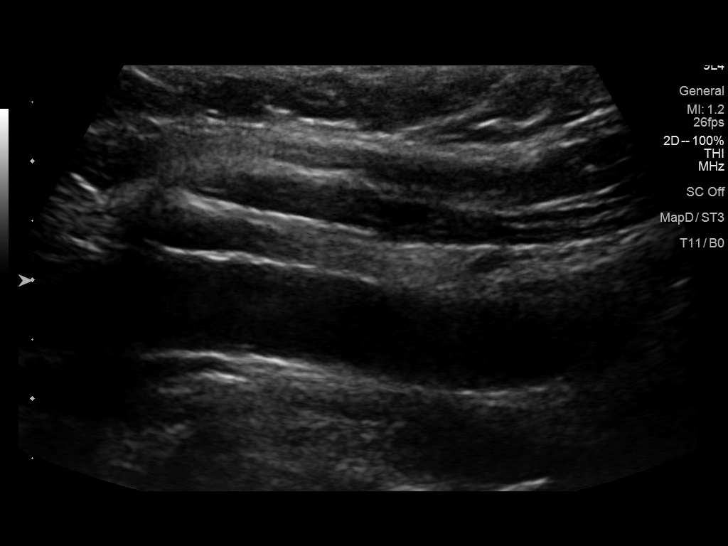
[im 15/29]
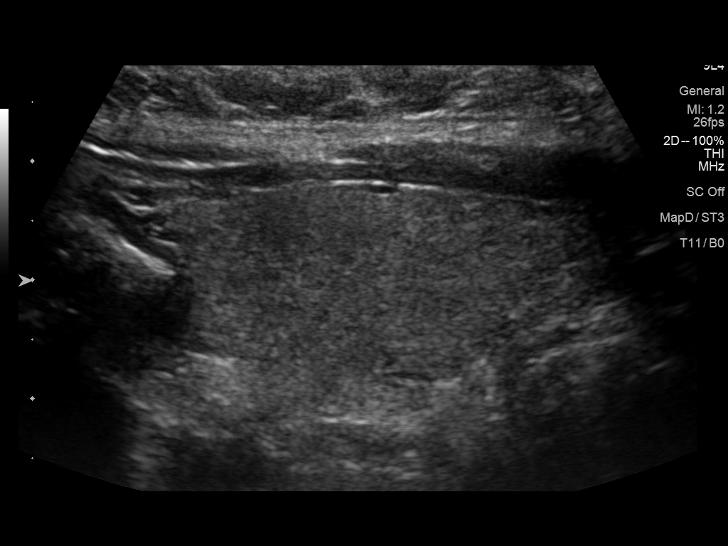
[im 17/29]
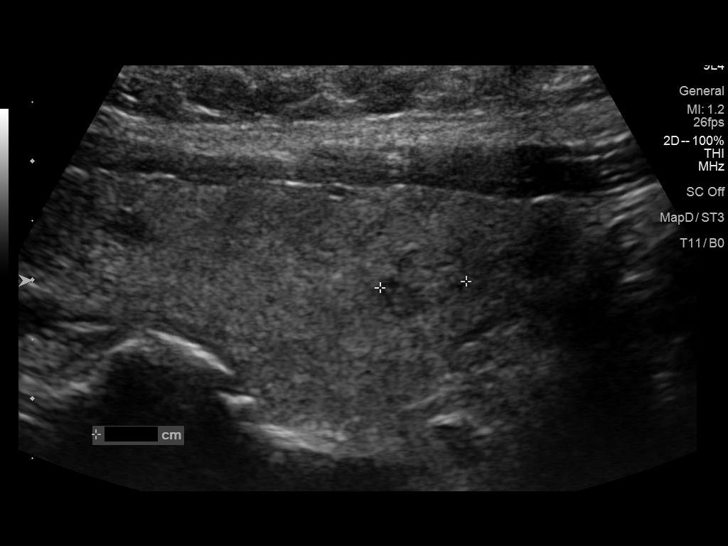
[im 19/29]
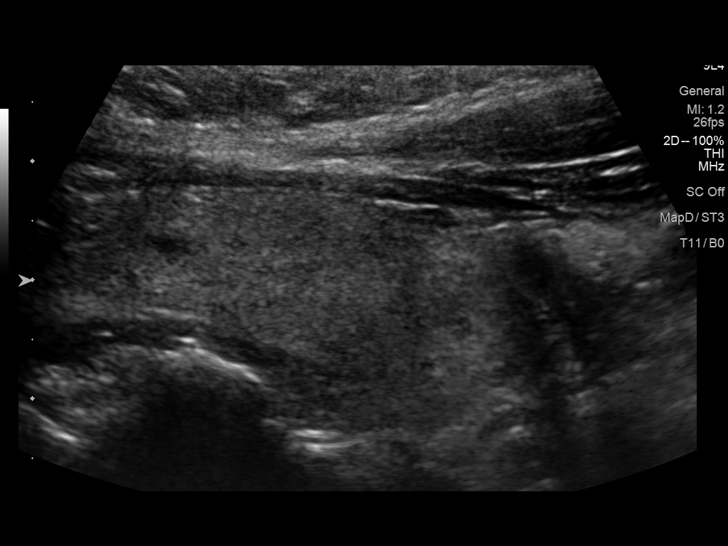
[im 22/29]
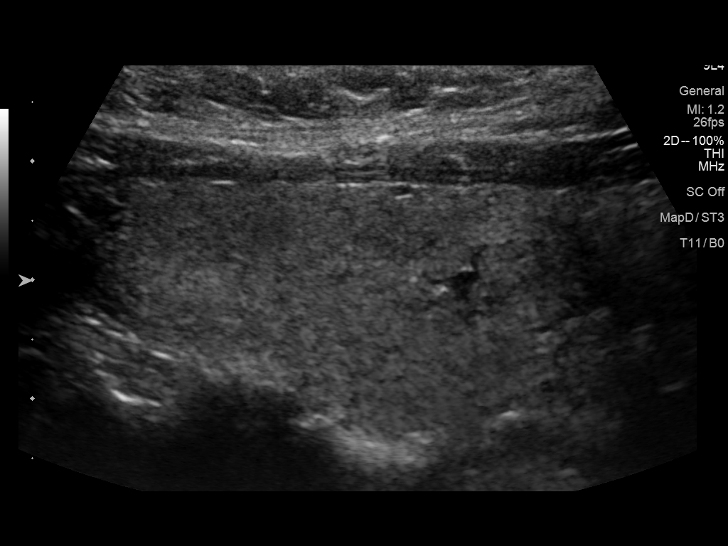
[im 24/29]
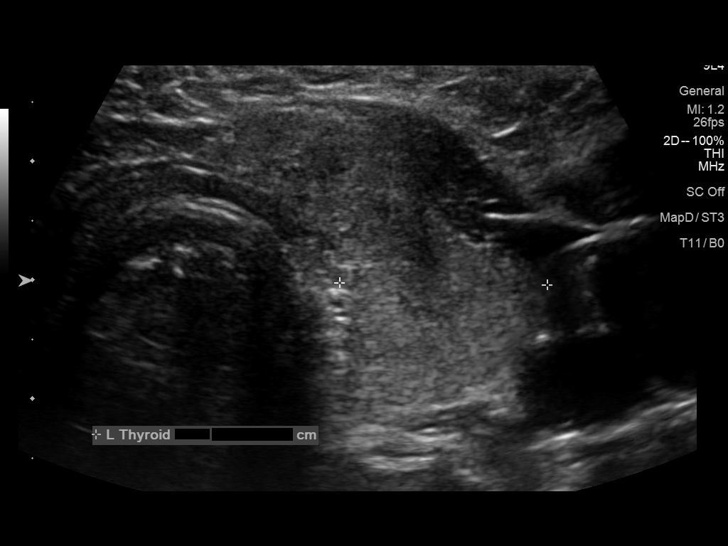
[im 26/29]
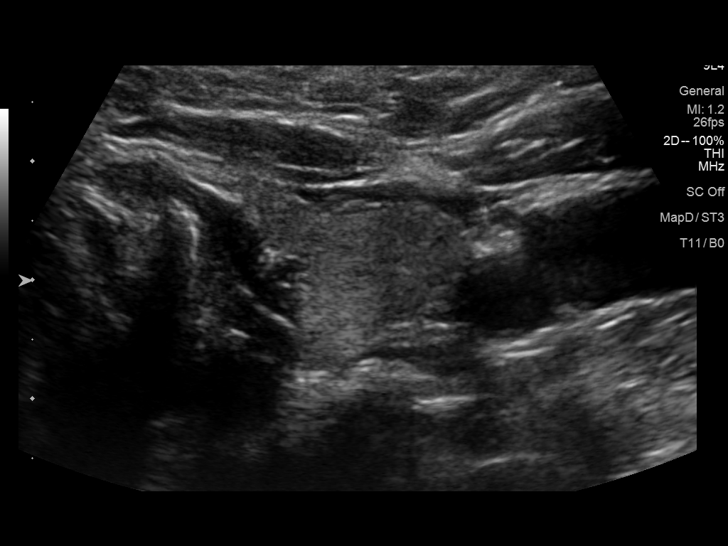
[im 29/29]
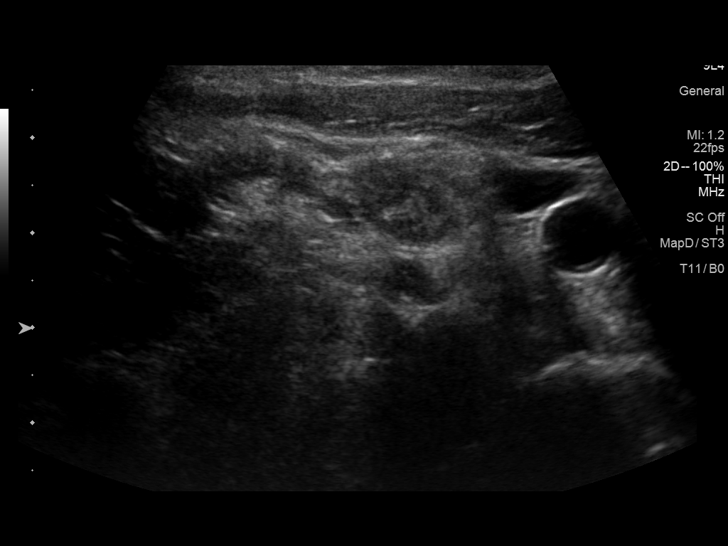

[13 of 25 positions shown; findings below may reference images not displayed]

FINDINGS: Parenchymal Echotexture: Moderately heterogenous

Isthmus: Normal in size measures 0.3 cm in diameter, unchanged

Right lobe: Post surgically absent. There is no residual nodular
soft tissue within the right lobectomy resection bed.

Left lobe: Normal in size measuring 4.5 x 2.1 x 1.8 cm, unchanged
previously, 4.5 x 2.1 x 2.2 cm

_________________________________________________________

Estimated total number of nodules >/= 1 cm: 1

Number of spongiform nodules >/=  2 cm not described below (TR1): 0

Number of mixed cystic and solid nodules >/= 1.5 cm not described
below (TR2): 0

_________________________________________________________

Nodule # 1:

Prior biopsy: No

Location: Isthmus; Mid

Maximum size: 1.9 cm; Other 2 dimensions: 1.6 x 1.5 cm, previously,
1.8 x 1.6 x 1.0 cm (when compared to the [DATE] examination)

Composition: solid/almost completely solid (2)

Echogenicity: hypoechoic (2)

Shape: not taller-than-wide (0)

Margins: ill-defined (0)

Echogenic foci: none (0)

ACR TI-RADS total points: 4.

ACR TI-RADS risk category:  TR4 (4-6 points).

Significant change in size (>/= 20% in two dimensions and minimal
increase of 2 mm): Yes

Change in features: Yes-nodule now appears hypoechoic, previously,
isoechoic

Change in ACR TI-RADS risk category: No

ACR TI-RADS recommendations:

**Given size (>/= 1.5 cm) and appearance, fine needle aspiration of
this moderately suspicious nodule should be considered based on
TI-RADS criteria.

_________________________________________________________

The approximately 0.7 x 0.6 x 0.6 cm hypoechoic ill-defined
nodule/pseudonodule within the inferior aspect the left lobe of the
thyroid (labeled 2) is grossly unchanged compared to the [DATE]
examination, previously, 0.7 x 0.5 x 0.5 cm, and again does not meet
criteria to recommend percutaneous sampling or continued dedicated
follow-up.
IMPRESSION: 1. Interval increase in size of the now approximately 1.9 cm nodule
within the left-side of the isthmus, which also now appears
hypoechoic, previously isoechoic, and as such now meets imaging
criteria to recommend percutaneous sampling as clinically indicated.
2. Stable sequela of previous right thyroid lobectomy without
evidence of residual or locally recurrent disease.

The above is in keeping with the ACR TI-RADS recommendations - [HOSPITAL] 2364;[DATE].

## 2020-07-19 ENCOUNTER — Ambulatory Visit: Payer: PRIVATE HEALTH INSURANCE | Admitting: Nurse Practitioner

## 2020-07-19 ENCOUNTER — Encounter: Payer: Self-pay | Admitting: Nurse Practitioner

## 2020-07-19 ENCOUNTER — Other Ambulatory Visit: Payer: Self-pay

## 2020-07-19 VITALS — BP 122/68 | HR 66 | Temp 98.8°F | Ht 64.6 in | Wt 173.2 lb

## 2020-07-19 DIAGNOSIS — R634 Abnormal weight loss: Secondary | ICD-10-CM

## 2020-07-19 DIAGNOSIS — R0789 Other chest pain: Secondary | ICD-10-CM | POA: Diagnosis not present

## 2020-07-19 DIAGNOSIS — R7303 Prediabetes: Secondary | ICD-10-CM | POA: Diagnosis not present

## 2020-07-19 DIAGNOSIS — E785 Hyperlipidemia, unspecified: Secondary | ICD-10-CM | POA: Diagnosis not present

## 2020-07-19 DIAGNOSIS — E559 Vitamin D deficiency, unspecified: Secondary | ICD-10-CM

## 2020-07-19 DIAGNOSIS — E041 Nontoxic single thyroid nodule: Secondary | ICD-10-CM

## 2020-07-19 MED ORDER — METFORMIN HCL 500 MG PO TABS: 500.0000 mg | ORAL_TABLET | Freq: Two times a day (BID) | ORAL | 1 refills | Status: DC

## 2020-07-19 NOTE — Progress Notes (Signed)
I,Yamilka Roman [employer] as a Education administrator for [employer], FNP.,have documented all relevant documentation on the behalf of [name], FNP,as directed by  [name], FNP while in the presence of [name], [place]. This visit occurred during the SARS-CoV-2 public health emergency.  Safety protocols were in place, including screening questions prior to the visit, additional usage of staff PPE, and extensive cleaning of exam room while observing appropriate contact time as indicated for disinfecting solutions.  Subjective:     Patient ID: Lori Mcdaniel , female    DOB: 12-20-1955 , 65 y.o.   MRN: [id number]   Chief Complaint  Patient presents with  . Prediabetes  . Weight Loss    Patient feels she has been losing a lot of weight and she would like her thyroid to be checked    HPI  Patient presents today for a f/u on her prediabetes and she also had some concerns of weight loss. She is having episodes where she has to take a deep breath.    Wt Readings from Last 3 Encounters: 07/19/20 : 173 lb 3.2 oz (78.6 kg) 02/09/20 : 181 lb 6.4 oz (82.3 kg) 11/06/19 : 184 lb 12.8 oz (83.8 kg)    Past Medical History:  Diagnosis Date  . GERD (gastroesophageal reflux disease)    years ago  . Thyroid mass of unclear etiology   . Type II diabetes mellitus (HCC)      Family History  Problem Relation Age of Onset  . Diabetes Mother   . Alzheimer's disease Mother   . Alzheimer's disease Father   . Stroke Father   . Breast cancer Neg Hx      Current Outpatient Medications:  .  atorvastatin (LIPITOR) 10 MG tablet, TAKE 1 TABLET BY MOUTH EVERY DAY, Disp: 90 tablet, Rfl: 0 .  meloxicam (MOBIC) 15 MG tablet, TAKE 1 TABLET(15 MG) BY MOUTH DAILY, Disp: 30 tablet, Rfl: 1 .  Vitamin D, Ergocalciferol, (DRISDOL) 1.25 MG (50000 UNIT) CAPS capsule, TAKE 1 CAPSULE BY MOUTH EVERY 7 DAYS, Disp: 12 capsule, Rfl: 0 .  metFORMIN (GLUCOPHAGE) 500 MG tablet, Take 1 tablet (500 mg total) by mouth 2 (two)  times daily with a meal., Disp: 180 tablet, Rfl: 1   Allergies  Allergen Reactions  . Penicillins Nausea And Vomiting    UNSPECIFIED REACTION  "Pt has had med in last 10 yrs - was ok to take" Has patient had a PCN reaction causing immediate rash, facial/tongue/throat swelling, SOB or lightheadedness with hypotension: no Has patient had a PCN reaction causing severe rash involving mucus membranes or skin necrosis: no Has patient had a PCN reaction that required hospitalization: no Has patient had a PCN reaction occurring within the last 10 years: no If all of the above answers are "NO", then may proceed with Cephalos     Review of Systems  Constitutional: Positive for unexpected weight change (she has lost 8 lbs since October).  Cardiovascular: Negative.   Gastrointestinal: Negative.   Endocrine: Negative for polydipsia, polyphagia and polyuria.  Skin: Negative.   Psychiatric/Behavioral: Negative.      Today's Vitals   07/19/20 1142  BP: 122/68  Pulse: 66  Temp: 98.8 F (37.1 C)  TempSrc: Oral  Weight: 173 lb 3.2 oz (78.6 kg)  Height: 5' 4.6" (1.641 m)  PainSc: 0-No pain   Body mass index is 29.18 kg/m.   Objective:  Physical Exam Constitutional:      General: She is not in acute distress.  Appearance: Normal appearance. She is obese.  Neck:     Thyroid: Thyroid mass (left lower thyoid/neck) present.  Cardiovascular:     Rate and Rhythm: Normal rate and regular rhythm.     Pulses: Normal pulses.     Heart sounds: Normal heart sounds. No murmur heard.   Pulmonary:     Effort: Pulmonary effort is normal. No respiratory distress.     Breath sounds: Normal breath sounds. No wheezing.  Skin:    General: Skin is warm and dry.     Comments: Slight keloid scar to mid neck from thyroid surgery  Neurological:     General: No focal deficit present.     Mental Status: She is alert and oriented to person, place, and time.     Cranial Nerves: No cranial nerve deficit.   Psychiatric:        Mood and Affect: Mood normal.        Behavior: Behavior normal.        Thought Content: Thought content normal.        Judgment: Judgment normal.         Assessment And Plan:     1. Prediabetes  Chronic, controlled  Continue with current medications  Encouraged to limit intake of sugary foods and drinks  Encouraged to increase physical activity to 150 minutes per week as tolerated, starting slow and increasing gradually. - metFORMIN (GLUCOPHAGE) 500 MG tablet; Take 1 tablet (500 mg total) by mouth 2 (two) times daily with a meal.  Dispense: 180 tablet; Refill: 1 - Hemoglobin A1c  2. Hyperlipidemia, unspecified hyperlipidemia type  Chronic, controlled  No current medications  Encouraged to eat a low fat diet - CMP14+EGFR  3. Vitamin D deficiency  Will check vitamin D level and supplement as needed.     Also encouraged to spend 15 minutes in the sun daily.  - Vitamin D (25 hydroxy)  4. Chest discomfort  Describes when moving has chest discomfort  This may be radiating discomfort to upper chest  5. Abnormal weight loss  She has lost 9 lbs since October  Will check thyroid functions and do a follow up thyroid ultrasound due to her history of thyroid nodules  - TSH - T4 - T3, free - Hemoglobin A1c - US THYROID; Future  6. Thyroid nodule  Firm nodule palpated to left side of thyroid  She has been been by ENT in the past - US THYROID; Future     Patient was given opportunity to ask questions. Patient verbalized understanding of the plan and was able to repeat key elements of the plan. All questions were answered to their satisfaction.  [name], FNP   I, [name], FNP, have reviewed all documentation for this visit. The documentation on 07/19/20 for the exam, diagnosis, procedures, and orders are all accurate and complete.   IF YOU HAVE BEEN REFERRED TO A SPECIALIST, IT MAY TAKE 1-2 WEEKS TO SCHEDULE/PROCESS THE REFERRAL. IF  YOU HAVE NOT HEARD FROM US/SPECIALIST IN TWO WEEKS, PLEASE GIVE [place] A CALL AT [phone].   THE PATIENT IS ENCOURAGED TO PRACTICE SOCIAL DISTANCING DUE TO THE COVID-19 PANDEMIC.

## 2020-07-20 LAB — CMP14+EGFR
ALT: 25 IU/L (ref 0–32)
AST: 22 IU/L (ref 0–40)
Albumin/Globulin Ratio: 1.6 (ref 1.2–2.2)
Albumin: 4.2 g/dL (ref 3.8–4.8)
Alkaline Phosphatase: 94 IU/L (ref 44–121)
BUN/Creatinine Ratio: 20 (ref 12–28)
BUN: 16 mg/dL (ref 8–27)
Bilirubin Total: 0.4 mg/dL (ref 0.0–1.2)
CO2: 24 mmol/L (ref 20–29)
Calcium: 9.5 mg/dL (ref 8.7–10.3)
Chloride: 102 mmol/L (ref 96–106)
Creatinine, Ser: 0.82 mg/dL (ref 0.57–1.00)
Globulin, Total: 2.6 g/dL (ref 1.5–4.5)
Glucose: 80 mg/dL (ref 65–99)
Potassium: 4.3 mmol/L (ref 3.5–5.2)
Sodium: 139 mmol/L (ref 134–144)
Total Protein: 6.8 g/dL (ref 6.0–8.5)
eGFR: 81 mL/min/{1.73_m2} (ref >59)

## 2020-07-20 LAB — VITAMIN D 25 HYDROXY (VIT D DEFICIENCY, FRACTURES): Vit D, 25-Hydroxy: 42.5 ng/mL (ref 30.0–100.0)

## 2020-07-20 LAB — HEMOGLOBIN A1C
Est. average glucose Bld gHb Est-mCnc: 120 mg/dL
Hgb A1c MFr Bld: 5.8 % — ABNORMAL HIGH (ref 4.8–5.6)

## 2020-07-20 LAB — T3, FREE: T3, Free: 3.1 pg/mL (ref 2.0–4.4)

## 2020-07-20 LAB — T4: T4, Total: 8.5 ug/dL (ref 4.5–12.0)

## 2020-07-20 LAB — TSH: TSH: 1.43 u[IU]/mL (ref 0.450–4.500)

## 2020-07-26 ENCOUNTER — Encounter: Payer: Self-pay | Admitting: Nurse Practitioner

## 2020-08-04 ENCOUNTER — Other Ambulatory Visit: Payer: Self-pay | Admitting: Nurse Practitioner

## 2020-08-04 ENCOUNTER — Ambulatory Visit
Admission: RE | Admit: 2020-08-04 | Discharge: 2020-08-04 | Disposition: A | Discharge status: Home / Self Care | Payer: PRIVATE HEALTH INSURANCE | Source: Ambulatory Visit | Attending: Nurse Practitioner | Admitting: Nurse Practitioner

## 2020-08-04 DIAGNOSIS — E041 Nontoxic single thyroid nodule: Secondary | ICD-10-CM

## 2020-08-04 DIAGNOSIS — R634 Abnormal weight loss: Secondary | ICD-10-CM

## 2020-08-26 ENCOUNTER — Telehealth: Payer: Self-pay

## 2020-08-26 NOTE — Telephone Encounter (Signed)
Patient called requesting that I send her biometric screening form to [phone] because her employer did not receive it.  I returned her call and advised her that her form has been faxed [facility]

## 2020-09-08 ENCOUNTER — Other Ambulatory Visit: Payer: Self-pay | Admitting: Nurse Practitioner

## 2020-09-08 DIAGNOSIS — Z1231 Encounter for screening mammogram for malignant neoplasm of breast: Secondary | ICD-10-CM

## 2020-10-01 ENCOUNTER — Other Ambulatory Visit: Payer: Self-pay | Admitting: Nurse Practitioner

## 2020-11-05 ENCOUNTER — Ambulatory Visit
Admission: RE | Admit: 2020-11-05 | Discharge: 2020-11-05 | Disposition: A | Discharge status: Home / Self Care | Payer: PRIVATE HEALTH INSURANCE | Source: Ambulatory Visit | Attending: Nurse Practitioner | Admitting: Nurse Practitioner

## 2020-11-05 ENCOUNTER — Other Ambulatory Visit: Payer: Self-pay

## 2020-11-05 DIAGNOSIS — Z1231 Encounter for screening mammogram for malignant neoplasm of breast: Secondary | ICD-10-CM

## 2020-11-18 ENCOUNTER — Other Ambulatory Visit: Payer: Self-pay

## 2020-11-18 ENCOUNTER — Encounter: Payer: Self-pay | Admitting: Nurse Practitioner

## 2020-11-18 ENCOUNTER — Ambulatory Visit: Payer: PRIVATE HEALTH INSURANCE | Admitting: Nurse Practitioner

## 2020-11-18 VITALS — BP 122/68 | HR 71 | Temp 98.7°F | Ht 64.6 in | Wt 170.6 lb

## 2020-11-18 DIAGNOSIS — R21 Rash and other nonspecific skin eruption: Secondary | ICD-10-CM

## 2020-11-18 DIAGNOSIS — R238 Other skin changes: Secondary | ICD-10-CM

## 2020-11-18 DIAGNOSIS — R7303 Prediabetes: Secondary | ICD-10-CM | POA: Diagnosis not present

## 2020-11-18 DIAGNOSIS — E785 Hyperlipidemia, unspecified: Secondary | ICD-10-CM

## 2020-11-18 DIAGNOSIS — R233 Spontaneous ecchymoses: Secondary | ICD-10-CM

## 2020-11-18 MED ORDER — TRIAMCINOLONE ACETONIDE 0.025 % EX OINT: 1.0000 "application " | TOPICAL_OINTMENT | Freq: Two times a day (BID) | CUTANEOUS | 0 refills | Status: DC

## 2020-11-18 NOTE — Progress Notes (Signed)
I,Yamilka [employer] as a Education administrator for [employer], FNP.,have documented all relevant documentation on the behalf of [name], FNP,as directed by  [name], FNP while in the presence of [name], [place].  This visit occurred during the SARS-CoV-2 public health emergency.  Safety protocols were in place, including screening questions prior to the visit, additional usage of staff PPE, and extensive cleaning of exam room while observing appropriate contact time as indicated for disinfecting solutions.  Subjective:     Patient ID: Lori Mcdaniel , female    DOB: Sep 05, 1955 , 65 y.o.   MRN: [id number]   Chief Complaint  Patient presents with   Prediabetes   Hyperlipidemia    HPI  Patient presents today for a f/u on her prediabetes   Hyperlipidemia This is a chronic problem. The current episode started more than 1 year ago. The problem is controlled. Recent lipid tests were reviewed and are normal. There are no known factors aggravating her hyperlipidemia. There are no compliance problems.     Past Medical History:  Diagnosis Date   GERD (gastroesophageal reflux disease)    years ago   Thyroid mass of unclear etiology    Type II diabetes mellitus ([place])      Family History  Problem Relation Age of Onset   Diabetes Mother    Alzheimer's disease Mother    Alzheimer's disease Father    Stroke Father    Breast cancer Neg Hx      Current Outpatient Medications:    atorvastatin (LIPITOR) 10 MG tablet, TAKE 1 TABLET BY MOUTH EVERY DAY, Disp: 90 tablet, Rfl: 0   metFORMIN (GLUCOPHAGE) 500 MG tablet, Take 1 tablet (500 mg total) by mouth 2 (two) times daily with a meal., Disp: 180 tablet, Rfl: 1   triamcinolone (KENALOG) 0.025 % ointment, Apply 1 application topically 2 (two) times daily., Disp: 30 g, Rfl: 0   Vitamin D, Ergocalciferol, (DRISDOL) 1.25 MG (50000 UNIT) CAPS capsule, TAKE 1 CAPSULE BY MOUTH EVERY 7 DAYS (Patient not taking: Reported on 11/18/2020), Disp: 12  capsule, Rfl: 0   Allergies  Allergen Reactions   Penicillins Nausea And Vomiting    UNSPECIFIED REACTION  "Pt has had med in last 10 yrs - was ok to take" Has patient had a PCN reaction causing immediate rash, facial/tongue/throat swelling, SOB or lightheadedness with hypotension: no Has patient had a PCN reaction causing severe rash involving mucus membranes or skin necrosis: no Has patient had a PCN reaction that required hospitalization: no Has patient had a PCN reaction occurring within the last 10 years: no If all of the above answers are "NO", then may proceed with Cephalos     Review of Systems  Constitutional: Negative.   Eyes: Negative.   Endocrine: Negative.  Negative for polydipsia, polyphagia and polyuria.  Musculoskeletal: Negative.   Skin:  Positive for rash (discoloration to right forearm).  Hematological:  Bruises/bleeds easily (right arm, comes and goes).  Psychiatric/Behavioral: Negative.      Today's Vitals   11/18/20 1613  BP: 122/68  Pulse: 71  Temp: 98.7 F (37.1 C)  TempSrc: Oral  Weight: 170 lb 9.6 oz (77.4 kg)  Height: 5' 4.6" (1.641 m)   Body mass index is 28.74 kg/m.  Wt Readings from Last 3 Encounters:  11/18/20 170 lb 9.6 oz (77.4 kg)  07/19/20 173 lb 3.2 oz (78.6 kg)  02/09/20 181 lb 6.4 oz (82.3 kg)    BP Readings from Last 3 Encounters:  11/18/20  122/68  07/19/20 122/68  02/09/20 138/66    Objective:  Physical Exam Constitutional:      General: She is not in acute distress.    Appearance: Normal appearance. She is obese.  Cardiovascular:     Rate and Rhythm: Normal rate and regular rhythm.     Pulses: Normal pulses.     Heart sounds: Normal heart sounds. No murmur heard. Pulmonary:     Effort: Pulmonary effort is normal. No respiratory distress.     Breath sounds: Normal breath sounds. No wheezing.  Skin:    General: Skin is warm and dry.     Findings: Bruising present.     Comments: Slight keloid scar to mid neck from  thyroid surgery  Neurological:     General: No focal deficit present.     Mental Status: She is alert and oriented to person, place, and time.     Cranial Nerves: No cranial nerve deficit.  Psychiatric:        Mood and Affect: Mood normal.        Behavior: Behavior normal.        Thought Content: Thought content normal.        Judgment: Judgment normal.        Assessment And Plan:     1. Prediabetes Comments: Chronic, tolerating metformin well - CMP14+EGFR - Hemoglobin A1c  2. Hyperlipidemia, unspecified hyperlipidemia type Comments: Stable,tolerating medications well - CMP14+EGFR  3. Rash and nonspecific skin eruption - triamcinolone (KENALOG) 0.025 % ointment; Apply 1 application topically 2 (two) times daily.  Dispense: 30 g; Refill: 0  4. Easy bruising Comments: Will check for abnormal platelets, few areas with purple colored areas of skin - CBC - CMP14+EGFR     Patient was given opportunity to ask questions. Patient verbalized understanding of the plan and was able to repeat key elements of the plan. All questions were answered to their satisfaction.  [name], FNP   I, [name], FNP, have reviewed all documentation for this visit. The documentation on 11/18/20 for the exam, diagnosis, procedures, and orders are all accurate and complete.   IF YOU HAVE BEEN REFERRED TO A SPECIALIST, IT MAY TAKE 1-2 WEEKS TO SCHEDULE/PROCESS THE REFERRAL. IF YOU HAVE NOT HEARD FROM US/SPECIALIST IN TWO WEEKS, PLEASE GIVE [place] A CALL AT [phone].   THE PATIENT IS ENCOURAGED TO PRACTICE SOCIAL DISTANCING DUE TO THE COVID-19 PANDEMIC.

## 2020-11-18 NOTE — Patient Instructions (Signed)
Diabetes Care, 44(Suppl 1), S34-S39. [email or web address]">  Prediabetes Prediabetes is when your blood sugar (blood glucose) level is higher than normal but not high enough for you to be diagnosed with type 2 diabetes. Having prediabetes puts you at risk for developing type 2 diabetes (type 2 diabetes mellitus). With certain lifestyle changes, you may be able to prevent or delay the onset of type 2 diabetes. This is important because type 2 diabetes can lead to serious complications, such as: Heart disease. Stroke. Blindness. Kidney disease. Depression. Poor circulation in the feet and legs. In severe cases, this could lead to surgical removal of a leg (amputation). What are the causes? The exact cause of prediabetes is not known. It may result from insulin resistance. Insulin resistance develops when cells in the body do not respond properly to insulin that the body makes. This can cause excess glucose to build up in the blood. High blood glucose (hyperglycemia) can develop. What increases the risk? The following factors may make you more likely to develop this condition: You have a family member with type 2 diabetes. You are older than 45 years. You had a temporary form of diabetes during a pregnancy (gestational diabetes). You had polycystic ovary syndrome (PCOS). You are overweight or obese. You are inactive (sedentary). You have a history of heart disease, including problems with cholesterol levels, high levels of blood fats, or high blood pressure. What are the signs or symptoms? You may have no symptoms. If you do have symptoms, they may include: Increased hunger. Increased thirst. Increased urination. Vision changes, such as blurry vision. Tiredness (fatigue). How is this diagnosed? This condition can be diagnosed with blood tests. Your blood glucose may be checked with one or more of the following tests: A fasting blood glucose (FBG) test. You will  not be allowed to eat (you will fast) for at least 8 hours before a blood sample is taken. An A1C blood test (hemoglobin A1C). This test provides information about blood glucose levels over the previous 2?3 months. An oral glucose tolerance test (OGTT). This test measures your blood glucose at two points in time: After fasting. This is your baseline level. Two hours after you drink a beverage that contains glucose. You may be diagnosed with prediabetes if: Your FBG is 100?125 mg/dL (5.6-6.9 mmol/L). Your A1C level is 5.7?6.4% (39-46 mmol/mol). Your OGTT result is 140?199 mg/dL (7.8-11 mmol/L). These blood tests may be repeated to confirm your diagnosis. How is this treated? Treatment may include dietary and lifestyle changes to help lower your blood glucose and prevent type 2 diabetes from developing. In some cases, medicinemay be prescribed to help lower the risk of type 2 diabetes. Follow these instructions at home: Nutrition  Follow a healthy meal plan. This includes eating lean proteins, whole grains, legumes, fresh fruits and vegetables, low-fat dairy products, and healthy fats. Follow instructions from your health care provider about eating or drinking restrictions. Meet with a dietitian to create a healthy eating plan that is right for you.  Lifestyle Do moderate-intensity exercise for at least 30 minutes a day on 5 or more days each week, or as told by your health care provider. A mix of activities may be best, such as: Brisk walking, swimming, biking, and weight lifting. Lose weight as told by your health care provider. Losing 5-7% of your body weight can reverse insulin resistance. Do not drink alcohol if: Your health care provider tells you not to drink. You are pregnant, may be pregnant, or are planning   to become pregnant. If you drink alcohol: Limit how much you use to: 0-1 drink a day for women. 0-2 drinks a day for men. Be aware of how much alcohol is in your drink. In  the [place] one drink equals one 12 oz bottle of beer (355 mL), one 5 oz glass of wine (148 mL), or one 1 oz glass of hard liquor (44 mL). General instructions Take over-the-counter and prescription medicines only as told by your health care provider. You may be prescribed medicines that help lower the risk of type 2 diabetes. Do not use any products that contain nicotine or tobacco, such as cigarettes, e-cigarettes, and chewing tobacco. If you need help quitting, ask your health care provider. Keep all follow-up visits. This is important. Where to find more information American Diabetes Association: www.diabetes.org Academy of Nutrition and Dietetics: www.eatright.org American Heart Association: www.heart.org Contact a health care provider if: You have any of these symptoms: Increased hunger. Increased urination. Increased thirst. Fatigue. Vision changes, such as blurry vision. Get help right away if you: Have shortness of breath. Feel confused. Vomit or feel like you may vomit. Summary Prediabetes is when your blood sugar (blood glucose)level is higher than normal but not high enough for you to be diagnosed with type 2 diabetes. Having prediabetes puts you at risk for developing type 2 diabetes (type 2 diabetes mellitus). Make lifestyle changes such as eating a healthy diet and exercising regularly to help prevent diabetes. Lose weight as told by your health care provider. This information is not intended to replace advice given to you by your health care provider. Make sure you discuss any questions you have with your healthcare provider. Document Revised: 07/10/2019 Document Reviewed: 07/10/2019 Elsevier Patient Education  [place].

## 2020-11-19 LAB — CMP14+EGFR
ALT: 28 IU/L (ref 0–32)
AST: 27 IU/L (ref 0–40)
Albumin/Globulin Ratio: 1.9 (ref 1.2–2.2)
Albumin: 4.5 g/dL (ref 3.8–4.8)
Alkaline Phosphatase: 100 IU/L (ref 44–121)
BUN/Creatinine Ratio: 19 (ref 12–28)
BUN: 17 mg/dL (ref 8–27)
Bilirubin Total: 0.4 mg/dL (ref 0.0–1.2)
CO2: 25 mmol/L (ref 20–29)
Calcium: 9.8 mg/dL (ref 8.7–10.3)
Chloride: 102 mmol/L (ref 96–106)
Creatinine, Ser: 0.89 mg/dL (ref 0.57–1.00)
Globulin, Total: 2.4 g/dL (ref 1.5–4.5)
Glucose: 82 mg/dL (ref 65–99)
Potassium: 4.7 mmol/L (ref 3.5–5.2)
Sodium: 142 mmol/L (ref 134–144)
Total Protein: 6.9 g/dL (ref 6.0–8.5)
eGFR: 73 mL/min/{1.73_m2} (ref >59)

## 2020-11-19 LAB — CBC
Hematocrit: 39 % (ref 34.0–46.6)
Hemoglobin: 13.3 g/dL (ref 11.1–15.9)
MCH: 30.4 pg (ref 26.6–33.0)
MCHC: 34.1 g/dL (ref 31.5–35.7)
MCV: 89 fL (ref 79–97)
Platelets: 234 10*3/uL (ref 150–450)
RBC: 4.38 x10E6/uL (ref 3.77–5.28)
RDW: 13 % (ref 11.7–15.4)
WBC: 5.3 10*3/uL (ref 3.4–10.8)

## 2020-11-19 LAB — HEMOGLOBIN A1C
Est. average glucose Bld gHb Est-mCnc: 120 mg/dL
Hgb A1c MFr Bld: 5.8 % — ABNORMAL HIGH (ref 4.8–5.6)

## 2020-12-30 ENCOUNTER — Other Ambulatory Visit: Payer: Self-pay | Admitting: Nurse Practitioner

## 2021-02-14 ENCOUNTER — Encounter: Payer: Self-pay | Admitting: Nurse Practitioner

## 2021-02-14 ENCOUNTER — Other Ambulatory Visit: Payer: Self-pay

## 2021-02-14 ENCOUNTER — Ambulatory Visit (INDEPENDENT_AMBULATORY_CARE_PROVIDER_SITE_OTHER): Payer: PRIVATE HEALTH INSURANCE | Admitting: Nurse Practitioner

## 2021-02-14 VITALS — BP 132/70 | HR 67 | Temp 97.8°F | Ht 64.0 in | Wt 173.2 lb

## 2021-02-14 DIAGNOSIS — Z0000 Encounter for general adult medical examination without abnormal findings: Secondary | ICD-10-CM | POA: Diagnosis not present

## 2021-02-14 DIAGNOSIS — E785 Hyperlipidemia, unspecified: Secondary | ICD-10-CM

## 2021-02-14 DIAGNOSIS — E559 Vitamin D deficiency, unspecified: Secondary | ICD-10-CM | POA: Diagnosis not present

## 2021-02-14 DIAGNOSIS — R7303 Prediabetes: Secondary | ICD-10-CM | POA: Diagnosis not present

## 2021-02-14 DIAGNOSIS — Z79899 Other long term (current) drug therapy: Secondary | ICD-10-CM

## 2021-02-14 DIAGNOSIS — Z2821 Immunization not carried out because of patient refusal: Secondary | ICD-10-CM

## 2021-02-14 NOTE — Patient Instructions (Addendum)
Health Maintenance, Female Adopting a healthy lifestyle and getting preventive care are important in promoting health and wellness. Ask your health care provider about: The right schedule for you to have regular tests and exams. Things you can do on your own to prevent diseases and keep yourself healthy. What should I know about diet, weight, and exercise? Eat a healthy diet  Eat a diet that includes plenty of vegetables, fruits, low-fat dairy products, and lean protein. Do not eat a lot of foods that are high in solid fats, added sugars, or sodium. Maintain a healthy weight Body mass index (BMI) is used to identify weight problems. It estimates body fat based on height and weight. Your health care provider can help determine your BMI and help you achieve or maintain a healthy weight. Get regular exercise Get regular exercise. This is one of the most important things you can do for your health. Most adults should: Exercise for at least 150 minutes each week. The exercise should increase your heart rate and make you sweat (moderate-intensity exercise). Do strengthening exercises at least twice a week. This is in addition to the moderate-intensity exercise. Spend less time sitting. Even light physical activity can be beneficial. Watch cholesterol and blood lipids Have your blood tested for lipids and cholesterol at 63 years of age, then have this test every 5 years. Have your cholesterol levels checked more often if: Your lipid or cholesterol levels are high. You are older than 63 years of age. You are at high risk for heart disease. What should I know about cancer screening? Depending on your health history and family history, you may need to have cancer screening at various ages. This may include screening for: Breast cancer. Cervical cancer. Colorectal cancer. Skin cancer. Lung cancer. What should I know about heart disease, diabetes, and high blood pressure? Blood pressure and heart  disease High blood pressure causes heart disease and increases the risk of stroke. This is more likely to develop in people who have high blood pressure readings, are of African descent, or are overweight. Have your blood pressure checked: Every 3-5 years if you are 18-39 years of age. Every year if you are 40 years old or older. Diabetes Have regular diabetes screenings. This checks your fasting blood sugar level. Have the screening done: Once every three years after age 40 if you are at a normal weight and have a low risk for diabetes. More often and at a younger age if you are overweight or have a high risk for diabetes. What should I know about preventing infection? Hepatitis B If you have a higher risk for hepatitis B, you should be screened for this virus. Talk with your health care provider to find out if you are at risk for hepatitis B infection. Hepatitis C Testing is recommended for: Everyone born from 1945 through 1965. Anyone with known risk factors for hepatitis C. Sexually transmitted infections (STIs) Get screened for STIs, including gonorrhea and chlamydia, if: You are sexually active and are younger than 63 years of age. You are older than 63 years of age and your health care provider tells you that you are at risk for this type of infection. Your sexual activity has changed since you were last screened, and you are at increased risk for chlamydia or gonorrhea. Ask your health care provider if you are at risk. Ask your health care provider about whether you are at high risk for HIV. Your health care provider may recommend a prescription medicine   to help prevent HIV infection. If you choose to take medicine to prevent HIV, you should first get tested for HIV. You should then be tested every 3 months for as long as you are taking the medicine. Pregnancy If you are about to stop having your period (premenopausal) and you may become pregnant, seek counseling before you get  pregnant. Take 400 to 800 micrograms (mcg) of folic acid every day if you become pregnant. Ask for birth control (contraception) if you want to prevent pregnancy. Osteoporosis and menopause Osteoporosis is a disease in which the bones lose minerals and strength with aging. This can result in bone fractures. If you are 40 years old or older, or if you are at risk for osteoporosis and fractures, ask your health care provider if you should: Be screened for bone loss. Take a calcium or vitamin D supplement to lower your risk of fractures. Be given hormone replacement therapy (HRT) to treat symptoms of menopause. Follow these instructions at home: Lifestyle Do not use any products that contain nicotine or tobacco, such as cigarettes, e-cigarettes, and chewing tobacco. If you need help quitting, ask your health care provider. Do not use street drugs. Do not share needles. Ask your health care provider for help if you need support or information about quitting drugs. Alcohol use Do not drink alcohol if: Your health care provider tells you not to drink. You are pregnant, may be pregnant, or are planning to become pregnant. If you drink alcohol: Limit how much you use to 0-1 drink a day. Limit intake if you are breastfeeding. Be aware of how much alcohol is in your drink. In the [place] one drink equals one 12 oz bottle of beer (355 mL), one 5 oz glass of wine (148 mL), or one 1 oz glass of hard liquor (44 mL). General instructions Schedule regular health, dental, and eye exams. Stay current with your vaccines. Tell your health care provider if: You often feel depressed. You have ever been abused or do not feel safe at home. Summary Adopting a healthy lifestyle and getting preventive care are important in promoting health and wellness. Follow your health care provider's instructions about healthy diet, exercising, and getting tested or screened for diseases. Follow your health care provider's  instructions on monitoring your cholesterol and blood pressure. This information is not intended to replace advice given to you by your health care provider. Make sure you discuss any questions you have with your health care provider. Document Revised: 06/18/2020 Document Reviewed: 04/03/2018 Elsevier Patient Education  [place].   Avoid aspartame and sucrulose (sugar substitutes). Get truvia or stevia.

## 2021-02-14 NOTE — Progress Notes (Signed)
I,Tianna Badgett,acting as a Education administrator for [employer], FNP.,have documented all relevant documentation on the behalf of [name], FNP,as directed by  [name], FNP while in the presence of [name], [place].  This visit occurred during the SARS-CoV-2 public health emergency.  Safety protocols were in place, including screening questions prior to the visit, additional usage of staff PPE, and extensive cleaning of exam room while observing appropriate contact time as indicated for disinfecting solutions.  Subjective:     Patient ID: Lori Mcdaniel , female    DOB: 11-May-1955 , 65 y.o.   MRN: [id number]   Chief Complaint  Patient presents with   Annual Exam    HPI  Patient here for her HM.    Wt Readings from Last 3 Encounters: 02/14/21 : 173 lb 3.2 oz (78.6 kg) 11/18/20 : 170 lb 9.6 oz (77.4 kg) 07/19/20 : 173 lb 3.2 oz (78.6 kg)      Past Medical History:  Diagnosis Date   GERD (gastroesophageal reflux disease)    years ago   Thyroid mass of unclear etiology      Family History  Problem Relation Age of Onset   Diabetes Mother    Alzheimer's disease Mother    Alzheimer's disease Father    Stroke Father    Breast cancer Neg Hx      Current Outpatient Medications:    atorvastatin (LIPITOR) 10 MG tablet, TAKE 1 TABLET BY MOUTH EVERY DAY, Disp: 90 tablet, Rfl: 0   metFORMIN (GLUCOPHAGE) 500 MG tablet, Take 1 tablet (500 mg total) by mouth 2 (two) times daily with a meal., Disp: 180 tablet, Rfl: 1   triamcinolone (KENALOG) 0.025 % ointment, Apply 1 application topically 2 (two) times daily., Disp: 30 g, Rfl: 0   Allergies  Allergen Reactions   Penicillins Nausea And Vomiting    UNSPECIFIED REACTION  "Pt has had med in last 10 yrs - was ok to take" Has patient had a PCN reaction causing immediate rash, facial/tongue/throat swelling, SOB or lightheadedness with hypotension: no Has patient had a PCN reaction causing severe rash involving mucus membranes or skin  necrosis: no Has patient had a PCN reaction that required hospitalization: no Has patient had a PCN reaction occurring within the last 10 years: no If all of the above answers are "NO", then may proceed with Cephalos      The patient states she had a partial hysterectomy.   Negative for Dysmenorrhea and Negative for Menorrhagia. Negative for: breast discharge, breast lump(s), breast pain and breast self exam. Associated symptoms include abnormal vaginal bleeding. Pertinent negatives include abnormal bleeding (hematology), anxiety, decreased libido, depression, difficulty falling sleep, dyspareunia, history of infertility, nocturia, sexual dysfunction, sleep disturbances, urinary incontinence, urinary urgency, vaginal discharge and vaginal itching. Diet regular; she has changed her eating habits with eating baked foods, vegetables and limiting her intake of sweets. She is not drink coffee as much. The patient states her exercise level is moderate - with bowling and walking 2 times a week outside of work.   The patient's tobacco use is:  Social History   Tobacco Use  Smoking Status Never  Smokeless Tobacco Never   She has been exposed to passive smoke. The patient's alcohol use is:  Social History   Substance and Sexual Activity  Alcohol Use No     Review of Systems  Constitutional: Negative.   HENT: Negative.    Eyes: Negative.   Respiratory: Negative.    Cardiovascular: Negative.  Negative  for chest pain, palpitations and leg swelling.  Gastrointestinal: Negative.   Endocrine: Negative.  Negative for polydipsia, polyphagia and polyuria.  Genitourinary: Negative.   Musculoskeletal: Negative.   Skin: Negative.   Allergic/Immunologic: Negative.   Neurological: Negative.   Hematological: Negative.   Psychiatric/Behavioral: Negative.      Today's Vitals   02/14/21 1550  BP: 132/70  Pulse: 67  Temp: 97.8 F (36.6 C)  TempSrc: Oral  Weight: 173 lb 3.2 oz (78.6 kg)  Height: 5'  4" (1.626 m)   Body mass index is 29.73 kg/m.  Wt Readings from Last 3 Encounters:  02/14/21 173 lb 3.2 oz (78.6 kg)  11/18/20 170 lb 9.6 oz (77.4 kg)  07/19/20 173 lb 3.2 oz (78.6 kg)    Objective:  Physical Exam Vitals reviewed.  Constitutional:      General: She is not in acute distress.    Appearance: Normal appearance. She is well-developed.  HENT:     Head: Normocephalic and atraumatic.     Right Ear: Hearing, tympanic membrane, ear canal and external ear normal. There is no impacted cerumen.     Left Ear: Hearing, tympanic membrane, ear canal and external ear normal. There is no impacted cerumen.     Nose:     Comments: Deferred - masked    Mouth/Throat:     Comments: Deferred - masked Eyes:     General: Lids are normal.     Extraocular Movements: Extraocular movements intact.     Conjunctiva/sclera: Conjunctivae normal.     Pupils: Pupils are equal, round, and reactive to light.     Funduscopic exam:    Right eye: No papilledema.        Left eye: No papilledema.  Neck:     Thyroid: No thyroid mass.     Vascular: No carotid bruit.  Cardiovascular:     Rate and Rhythm: Normal rate and regular rhythm.     Pulses: Normal pulses.     Heart sounds: Normal heart sounds. No murmur heard. Pulmonary:     Effort: Pulmonary effort is normal. No respiratory distress.     Breath sounds: Normal breath sounds. No wheezing.  Chest:     Chest wall: No mass.  Breasts:    Tanner Score is 5.     Right: Normal. No mass or tenderness.     Left: Normal. No mass or tenderness.  Abdominal:     General: Abdomen is flat. Bowel sounds are normal. There is no distension.     Palpations: Abdomen is soft.     Tenderness: There is no abdominal tenderness.  Genitourinary:    Rectum: Guaiac result negative.  Musculoskeletal:        General: No swelling or tenderness. Normal range of motion.     Cervical back: Full passive range of motion without pain, normal range of motion and neck  supple.     Right lower leg: No edema.     Left lower leg: No edema.  Lymphadenopathy:     Upper Body:     Right upper body: No supraclavicular, axillary or pectoral adenopathy.     Left upper body: No supraclavicular, axillary or pectoral adenopathy.  Skin:    General: Skin is warm and dry.     Capillary Refill: Capillary refill takes less than 2 seconds.  Neurological:     General: No focal deficit present.     Mental Status: She is alert and oriented to person, place, and time.  Cranial Nerves: No cranial nerve deficit.     Sensory: No sensory deficit.  Psychiatric:        Mood and Affect: Mood normal.        Behavior: Behavior normal.        Thought Content: Thought content normal.        Judgment: Judgment normal.        Assessment And Plan:     1. Encounter for annual physical exam Behavior modifications discussed and diet history reviewed.   Pt will continue to exercise regularly and modify diet with low GI, plant based foods and decrease intake of processed foods.  Recommend intake of daily multivitamin, Vitamin D, and calcium.  Recommend mammogram and colonolscopy (both up to date) for preventive screenings, as well as recommend immunizations that include influenza (declined), TDAP, and Shingles (reports had both doses already)  2. Hyperlipidemia, unspecified hyperlipidemia type Comments: Good control, diet controlled.  Continue avoiding fried and fatty foods.  - CMP14+EGFR - Lipid panel  3. Vitamin D deficiency Will check vitamin D level and supplement as needed.    Also encouraged to spend 15 minutes in the sun daily.   - VITAMIN D 25 Hydroxy (Vit-D Deficiency, Fractures)  4. Prediabetes Chronic, improving.  Continue with current medications, tolerating metformin well Encouraged to limit intake of sugary foods and drinks Encouraged to increase physical activity to 150 minutes per week - Hemoglobin A1c  5. Influenza vaccination declined Patient declined  influenza vaccination at this time. Patient is aware that influenza vaccine prevents illness in 70% of healthy people, and reduces hospitalizations to 30-70% in elderly. This vaccine is recommended annually. Pt is willing to accept risk associated with refusing vaccination.  6. Other long term (current) drug therapy - CBC     Patient was given opportunity to ask questions. Patient verbalized understanding of the plan and was able to repeat key elements of the plan. All questions were answered to their satisfaction.   [name], FNP   I, [name], FNP, have reviewed all documentation for this visit. The documentation on 02/14/21 for the exam, diagnosis, procedures, and orders are all accurate and complete.  THE PATIENT IS ENCOURAGED TO PRACTICE SOCIAL DISTANCING DUE TO THE COVID-19 PANDEMIC.

## 2021-02-15 LAB — CMP14+EGFR
ALT: 18 IU/L (ref 0–32)
AST: 16 IU/L (ref 0–40)
Albumin/Globulin Ratio: 1.5 (ref 1.2–2.2)
Albumin: 4.3 g/dL (ref 3.8–4.8)
Alkaline Phosphatase: 94 IU/L (ref 44–121)
BUN/Creatinine Ratio: 26 (ref 12–28)
BUN: 19 mg/dL (ref 8–27)
Bilirubin Total: <0.2 mg/dL (ref 0.0–1.2)
CO2: 26 mmol/L (ref 20–29)
Calcium: 9.9 mg/dL (ref 8.7–10.3)
Chloride: 105 mmol/L (ref 96–106)
Creatinine, Ser: 0.74 mg/dL (ref 0.57–1.00)
Globulin, Total: 2.8 g/dL (ref 1.5–4.5)
Glucose: 90 mg/dL (ref 70–99)
Potassium: 4.8 mmol/L (ref 3.5–5.2)
Sodium: 144 mmol/L (ref 134–144)
Total Protein: 7.1 g/dL (ref 6.0–8.5)
eGFR: 91 mL/min/{1.73_m2} (ref >59)

## 2021-02-15 LAB — CBC
Hematocrit: 40 % (ref 34.0–46.6)
Hemoglobin: 13.2 g/dL (ref 11.1–15.9)
MCH: 29.4 pg (ref 26.6–33.0)
MCHC: 33 g/dL (ref 31.5–35.7)
MCV: 89 fL (ref 79–97)
Platelets: 251 10*3/uL (ref 150–450)
RBC: 4.49 x10E6/uL (ref 3.77–5.28)
RDW: 13 % (ref 11.7–15.4)
WBC: 6 10*3/uL (ref 3.4–10.8)

## 2021-02-15 LAB — LIPID PANEL
Chol/HDL Ratio: 2.7 ratio (ref 0.0–4.4)
Cholesterol, Total: 182 mg/dL (ref 100–199)
HDL: 68 mg/dL (ref >39)
LDL Chol Calc (NIH): 103 mg/dL — ABNORMAL HIGH (ref 0–99)
Triglycerides: 56 mg/dL (ref 0–149)
VLDL Cholesterol Cal: 11 mg/dL (ref 5–40)

## 2021-02-15 LAB — HEMOGLOBIN A1C
Est. average glucose Bld gHb Est-mCnc: 117 mg/dL
Hgb A1c MFr Bld: 5.7 % — ABNORMAL HIGH (ref 4.8–5.6)

## 2021-02-15 LAB — VITAMIN D 25 HYDROXY (VIT D DEFICIENCY, FRACTURES): Vit D, 25-Hydroxy: 26.6 ng/mL — ABNORMAL LOW (ref 30.0–100.0)

## 2021-02-21 ENCOUNTER — Telehealth: Payer: Self-pay

## 2021-02-21 ENCOUNTER — Other Ambulatory Visit: Payer: Self-pay

## 2021-02-21 DIAGNOSIS — R7303 Prediabetes: Secondary | ICD-10-CM

## 2021-02-21 MED ORDER — METFORMIN HCL 500 MG PO TABS: 500.0000 mg | ORAL_TABLET | Freq: Two times a day (BID) | ORAL | 1 refills | Status: DC

## 2021-02-21 NOTE — Telephone Encounter (Signed)
Patient has been notified that her forms have been completed and faxed and I placed a copy in the mail for her as requested. YL,RMA

## 2021-03-30 ENCOUNTER — Other Ambulatory Visit: Payer: Self-pay | Admitting: Nurse Practitioner

## 2021-05-12 ENCOUNTER — Other Ambulatory Visit: Payer: Self-pay

## 2021-05-12 DIAGNOSIS — R7303 Prediabetes: Secondary | ICD-10-CM

## 2021-05-12 MED ORDER — METFORMIN HCL 500 MG PO TABS: 500.0000 mg | ORAL_TABLET | Freq: Two times a day (BID) | ORAL | 1 refills | Status: DC

## 2021-05-12 MED ORDER — ATORVASTATIN CALCIUM 10 MG PO TABS: 10.0000 mg | ORAL_TABLET | Freq: Every day | ORAL | 1 refills | Status: DC

## 2021-08-17 ENCOUNTER — Ambulatory Visit: Payer: PRIVATE HEALTH INSURANCE | Admitting: Nurse Practitioner

## 2021-08-17 ENCOUNTER — Encounter: Payer: Self-pay | Admitting: Nurse Practitioner

## 2021-08-17 VITALS — BP 128/64 | HR 75 | Temp 98.7°F | Ht 64.0 in | Wt 177.0 lb

## 2021-08-17 DIAGNOSIS — E6609 Other obesity due to excess calories: Secondary | ICD-10-CM | POA: Diagnosis not present

## 2021-08-17 DIAGNOSIS — Z6830 Body mass index (BMI) 30.0-30.9, adult: Secondary | ICD-10-CM

## 2021-08-17 DIAGNOSIS — E785 Hyperlipidemia, unspecified: Secondary | ICD-10-CM

## 2021-08-17 DIAGNOSIS — R7303 Prediabetes: Secondary | ICD-10-CM | POA: Diagnosis not present

## 2021-08-17 DIAGNOSIS — E559 Vitamin D deficiency, unspecified: Secondary | ICD-10-CM

## 2021-08-17 NOTE — Progress Notes (Signed)
I,Tianna Badgett,acting as a Education administrator for [employer], FNP.,have documented all relevant documentation on the behalf of [name], FNP,as directed by  [name], FNP while in the presence of [name], [place].  This visit occurred during the SARS-CoV-2 public health emergency.  Safety protocols were in place, including screening questions prior to the visit, additional usage of staff PPE, and extensive cleaning of exam room while observing appropriate contact time as indicated for disinfecting solutions.  Subjective:     Patient ID: Lori Mcdaniel , female    DOB: 05-21-1955 , 66 y.o.   MRN: [id number]   Chief Complaint  Patient presents with   Prediabetes    HPI  Patient presents today for a f/u on her prediabetes   Hyperlipidemia This is a chronic problem. The current episode started more than 1 year ago. The problem is controlled. Recent lipid tests were reviewed and are normal. There are no known factors aggravating her hyperlipidemia. There are no compliance problems.     Past Medical History:  Diagnosis Date   GERD (gastroesophageal reflux disease)    years ago   Thyroid mass of unclear etiology      Family History  Problem Relation Age of Onset   Diabetes Mother    Alzheimer's disease Mother    Alzheimer's disease Father    Stroke Father    Breast cancer Neg Hx      Current Outpatient Medications:    atorvastatin (LIPITOR) 10 MG tablet, Take 1 tablet (10 mg total) by mouth daily., Disp: 90 tablet, Rfl: 1   metFORMIN (GLUCOPHAGE) 500 MG tablet, Take 1 tablet (500 mg total) by mouth 2 (two) times daily with a meal., Disp: 180 tablet, Rfl: 1   Vitamin D, Ergocalciferol, (DRISDOL) 1.25 MG (50000 UNIT) CAPS capsule, Take 1 capsule (50,000 Units total) by mouth every 7 (seven) days., Disp: 12 capsule, Rfl: 1   Allergies  Allergen Reactions   Penicillins Nausea And Vomiting    UNSPECIFIED REACTION  "Pt has had med in last 10 yrs - was ok to take" Has patient had  a PCN reaction causing immediate rash, facial/tongue/throat swelling, SOB or lightheadedness with hypotension: no Has patient had a PCN reaction causing severe rash involving mucus membranes or skin necrosis: no Has patient had a PCN reaction that required hospitalization: no Has patient had a PCN reaction occurring within the last 10 years: no If all of the above answers are "NO", then may proceed with Cephalos     Review of Systems  Constitutional: Negative.   Respiratory: Negative.    Cardiovascular: Negative.   Gastrointestinal: Negative.   Musculoskeletal:        Right arm/elbow pain.   Neurological: Negative.     Today's Vitals   08/17/21 1457  BP: 128/64  Pulse: 75  Temp: 98.7 F (37.1 C)  TempSrc: Oral  Weight: 177 lb (80.3 kg)  Height: _0  (1.626 m)   Body mass index is 30.38 kg/m.  Wt Readings from Last 3 Encounters:  08/17/21 177 lb (80.3 kg)  02/14/21 173 lb 3.2 oz (78.6 kg)  11/18/20 170 lb 9.6 oz (77.4 kg)    Objective:  Physical Exam Vitals reviewed.  Constitutional:      General: She is not in acute distress.    Appearance: Normal appearance. She is obese.  Cardiovascular:     Rate and Rhythm: Normal rate and regular rhythm.     Pulses: Normal pulses.     Heart sounds: Normal  heart sounds. No murmur heard. Pulmonary:     Effort: Pulmonary effort is normal. No respiratory distress.     Breath sounds: Normal breath sounds. No wheezing.  Skin:    General: Skin is warm and dry.     Findings: No bruising.     Comments: Slight keloid scar to mid neck from thyroid surgery  Neurological:     General: No focal deficit present.     Mental Status: She is alert and oriented to person, place, and time.     Cranial Nerves: No cranial nerve deficit.     Motor: No weakness.  Psychiatric:        Mood and Affect: Mood normal.        Behavior: Behavior normal.        Thought Content: Thought content normal.        Judgment: Judgment normal.         Assessment And Plan:     1. Hyperlipidemia, unspecified hyperlipidemia type Comments: Stable, continue statin, tolerating well.  - Lipid panel  2. Vitamin D deficiency Comments: Will check vitamin d, will continue supplement pending results - VITAMIN D 25 Hydroxy (Vit-D Deficiency, Fractures)  3. Prediabetes - Hemoglobin A1c - CMP14+EGFR - Microalbumin / Creatinine Urine Ratio  4. Class 1 obesity due to excess calories without serious comorbidity with body mass index (BMI) of 30.0 to 30.9 in adult She is encouraged to strive for BMI less than 30 to decrease cardiac risk. Advised to aim for at least 150 minutes of exercise per week.     Patient was given opportunity to ask questions. Patient verbalized understanding of the plan and was able to repeat key elements of the plan. All questions were answered to their satisfaction.  [name], FNP   I, [name], FNP, have reviewed all documentation for this visit. The documentation on 07/2621 for the exam, diagnosis, procedures, and orders are all accurate and complete.   IF YOU HAVE BEEN REFERRED TO A SPECIALIST, IT MAY TAKE 1-2 WEEKS TO SCHEDULE/PROCESS THE REFERRAL. IF YOU HAVE NOT HEARD FROM US/SPECIALIST IN TWO WEEKS, PLEASE GIVE [place] A CALL AT [phone].   THE PATIENT IS ENCOURAGED TO PRACTICE SOCIAL DISTANCING DUE TO THE COVID-19 PANDEMIC.

## 2021-08-17 NOTE — Patient Instructions (Signed)
Preventing Type 2 Diabetes Mellitus Type 2 diabetes, also called type 2 diabetes mellitus, is a long-term (chronic) disease that affects sugar (glucose) levels in your blood. Normally, a hormone called insulin allows glucose to enter cells in your body. The cells use glucose for energy. With type 2 diabetes, you will have one or both of these problems: Your pancreas does not make enough insulin. Cells in your body do not respond properly to insulin that your body makes (insulin resistance). Insulin resistance or lack of insulin causes extra glucose to build up in the blood instead of going into cells. As a result, high blood glucose (hyperglycemia) develops. That can cause many complications. Being overweight or obese and having an inactive (sedentary) lifestyle can increase your risk for diabetes. Type 2 diabetes can be delayed or prevented by making certain nutrition and lifestyle changes. How can this condition affect me? If you do not take steps to prevent diabetes, your blood glucose levels may keep increasing over time. Too much glucose in your blood for a long time can damage your blood vessels, heart, kidneys, nerves, and eyes. Type 2 diabetes can lead to chronic health problems and complications, such as: Heart disease. Stroke. Blindness. Kidney disease. Depression. Poor circulation in your feet and legs. In severe cases, a foot or leg may need to be surgically removed (amputated). What can increase my risk? You may be more likely to develop type 2 diabetes if you: Have type 2 diabetes in your family. Are overweight or obese. Have a sedentary lifestyle. Have insulin resistance or a history of prediabetes. Have a history of pregnancy-related (gestational) diabetes or polycystic ovary syndrome (PCOS). What actions can I take to prevent this? It can be difficult to recognize signs of type 2 diabetes. Taking action to prevent the disease before you develop symptoms is the best way to avoid  possible damage to your body. Making certain nutrition and lifestyle changes may prevent or delay the disease and related health problems. Nutrition  Eat healthy meals and snacks regularly. Do not skip meals. Fruit or a handful of nuts is a healthy snack between meals. Drink water throughout the day. Avoid drinks that contain added sugar, such as soda or sweetened tea. Drink enough fluid to keep your urine pale yellow. Follow instructions from your health care provider about eating or drinking restrictions. Limit the amount of food you eat by: Managing how much you eat at a time (portion size). Checking food labels for the serving sizes of food. Using a kitchen scale to weigh amounts of food. Saut or steam food instead of frying it. Cook with water or broth instead of oils or butter. Limit saturated fat and salt (sodium) in your diet. Have no more than 1 tsp (2,400 mg) of sodium a day. If you have heart disease or high blood pressure, use less than ? tsp (1,500 mg) of sodium a day. Lifestyle  Lose weight if needed and as told. Your health care provider can determine how much weight loss is best for you and can help you lose weight safely. If you are overweight or obese, you may be told to lose at least 5?7% of your body weight. Manage blood pressure, cholesterol, and stress. Your health care provider will help determine the best treatment for you. Do not use any products that contain nicotine or tobacco. These products include cigarettes, chewing tobacco, and vaping devices, such as e-cigarettes. If you need help quitting, ask your health care provider. Activity  Do physical   activity that makes your heart beat faster and makes you sweat (moderate intensity). Do this for at least 30 minutes on at least 5 days of the week, or as much as told by your health care provider. Ask your health care provider what activities are safe for you. A mix of activities may be best, such as walking, swimming,  cycling, and strength training. Try to add physical activity into your day. For example: Park your car farther away than usual so that you walk more. Take a walk during your lunch break. Use stairs instead of elevators or escalators. Walk or bike to work instead of driving. Alcohol use If you drink alcohol: Limit how much you have to: 0?1 drink a day for women who are not pregnant. 0?2 drinks a day for men. Know how much alcohol is in your drink. In the [place] one drink equals one 12 oz bottle of beer (355 mL), one 5 oz glass of wine (148 mL), or one 1 oz glass of hard liquor (44 mL). General information Talk with your health care provider about your risk factors and how you can reduce your risk for diabetes. Have your blood glucose tested regularly, as told by your health care provider. Get screening tests as told by your health care provider. You may have these regularly, especially if you have certain risk factors for type 2 diabetes. Make an appointment with a registered dietitian. This diet and nutrition specialist can help you make a healthy eating plan and help you understand portion sizes and food labels. Where to find support Ask your health care provider to recommend a registered dietitian, a certified diabetes care and education specialist, or a weight loss program. Look for local or online weight loss groups. Join a gym, fitness club, or outdoor activity group, such as a walking club. Where to find more information For help and guidance and to learn more about diabetes and diabetes prevention, visit: American Diabetes Association (ADA): www.diabetes.org National Institute of Diabetes and Digestive and Kidney Diseases: www.niddk.nih.gov To learn more about healthy eating, visit: [place]. Department of Agriculture (USDA): www.choosemyplate.gov Office of Disease Prevention and Health Promotion (ODPHP): health.gov Summary You can delay or prevent type 2 diabetes by eating healthy  foods, losing weight if needed, and increasing your physical activity. Talk with your health care provider about your risk factors for type 2 diabetes and how you can reduce your risk. It can be difficult to recognize the signs of type 2 diabetes. The best way to avoid possible damage to your body is to take action to prevent the disease before you develop symptoms. Get screening tests as told by your health care provider. This information is not intended to replace advice given to you by your health care provider. Make sure you discuss any questions you have with your health care provider. Document Revised: 07/05/2020 Document Reviewed: 07/05/2020 Elsevier Patient Education  2023 Elsevier Inc.  

## 2021-08-18 LAB — MICROALBUMIN / CREATININE URINE RATIO
Creatinine, Urine: 139 mg/dL
Microalb/Creat Ratio: <2 mg/g creat (ref 0–29)
Microalbumin, Urine: <3 ug/mL

## 2021-08-18 LAB — HEMOGLOBIN A1C
Est. average glucose Bld gHb Est-mCnc: 128 mg/dL
Hgb A1c MFr Bld: 6.1 % — ABNORMAL HIGH (ref 4.8–5.6)

## 2021-08-18 LAB — LIPID PANEL
Chol/HDL Ratio: 2.5 ratio (ref 0.0–4.4)
Cholesterol, Total: 163 mg/dL (ref 100–199)
HDL: 66 mg/dL (ref >39)
LDL Chol Calc (NIH): 87 mg/dL (ref 0–99)
Triglycerides: 50 mg/dL (ref 0–149)
VLDL Cholesterol Cal: 10 mg/dL (ref 5–40)

## 2021-08-18 LAB — CMP14+EGFR
ALT: 14 IU/L (ref 0–32)
AST: 12 IU/L (ref 0–40)
Albumin/Globulin Ratio: 1.6 (ref 1.2–2.2)
Albumin: 4.1 g/dL (ref 3.8–4.8)
Alkaline Phosphatase: 111 IU/L (ref 44–121)
BUN/Creatinine Ratio: 15 (ref 12–28)
BUN: 14 mg/dL (ref 8–27)
Bilirubin Total: 0.2 mg/dL (ref 0.0–1.2)
CO2: 25 mmol/L (ref 20–29)
Calcium: 9.4 mg/dL (ref 8.7–10.3)
Chloride: 104 mmol/L (ref 96–106)
Creatinine, Ser: 0.92 mg/dL (ref 0.57–1.00)
Globulin, Total: 2.6 g/dL (ref 1.5–4.5)
Glucose: 77 mg/dL (ref 70–99)
Potassium: 4.7 mmol/L (ref 3.5–5.2)
Sodium: 143 mmol/L (ref 134–144)
Total Protein: 6.7 g/dL (ref 6.0–8.5)
eGFR: 70 mL/min/{1.73_m2} (ref >59)

## 2021-08-18 LAB — VITAMIN D 25 HYDROXY (VIT D DEFICIENCY, FRACTURES): Vit D, 25-Hydroxy: 23.1 ng/mL — ABNORMAL LOW (ref 30.0–100.0)

## 2021-09-06 ENCOUNTER — Other Ambulatory Visit: Payer: Self-pay | Admitting: Nurse Practitioner

## 2021-09-06 MED ORDER — VITAMIN D (ERGOCALCIFEROL) 1.25 MG (50000 UNIT) PO CAPS: 50000.0000 [IU] | ORAL_CAPSULE | ORAL | 1 refills | Status: DC

## 2021-10-20 ENCOUNTER — Other Ambulatory Visit: Payer: Self-pay

## 2021-10-20 DIAGNOSIS — R7303 Prediabetes: Secondary | ICD-10-CM

## 2021-10-20 MED ORDER — METFORMIN HCL 500 MG PO TABS: 500.0000 mg | ORAL_TABLET | Freq: Two times a day (BID) | ORAL | 1 refills | Status: DC

## 2021-12-16 ENCOUNTER — Other Ambulatory Visit: Payer: Self-pay | Admitting: Nurse Practitioner

## 2021-12-16 DIAGNOSIS — Z1231 Encounter for screening mammogram for malignant neoplasm of breast: Secondary | ICD-10-CM

## 2021-12-20 ENCOUNTER — Ambulatory Visit
Admission: RE | Admit: 2021-12-20 | Discharge: 2021-12-20 | Disposition: A | Discharge status: Home / Self Care | Payer: PRIVATE HEALTH INSURANCE | Source: Ambulatory Visit | Attending: Nurse Practitioner | Admitting: Nurse Practitioner

## 2021-12-20 DIAGNOSIS — Z1231 Encounter for screening mammogram for malignant neoplasm of breast: Secondary | ICD-10-CM

## 2022-01-03 IMAGING — MG MM DIGITAL SCREENING BILAT W/ TOMO AND CAD
8 series · 8 of 24 positions shown · non-contrast
Comparison: Previous exam(s).

CLINICAL DATA: Screening.

EXAM:
DIGITAL SCREENING BILATERAL MAMMOGRAM WITH TOMOSYNTHESIS AND CAD
TECHNIQUE: Bilateral screening digital craniocaudal and mediolateral oblique
mammograms were obtained. Bilateral screening digital breast
tomosynthesis was performed. The images were evaluated with
computer-aided detection.

[L CC synth-2D]
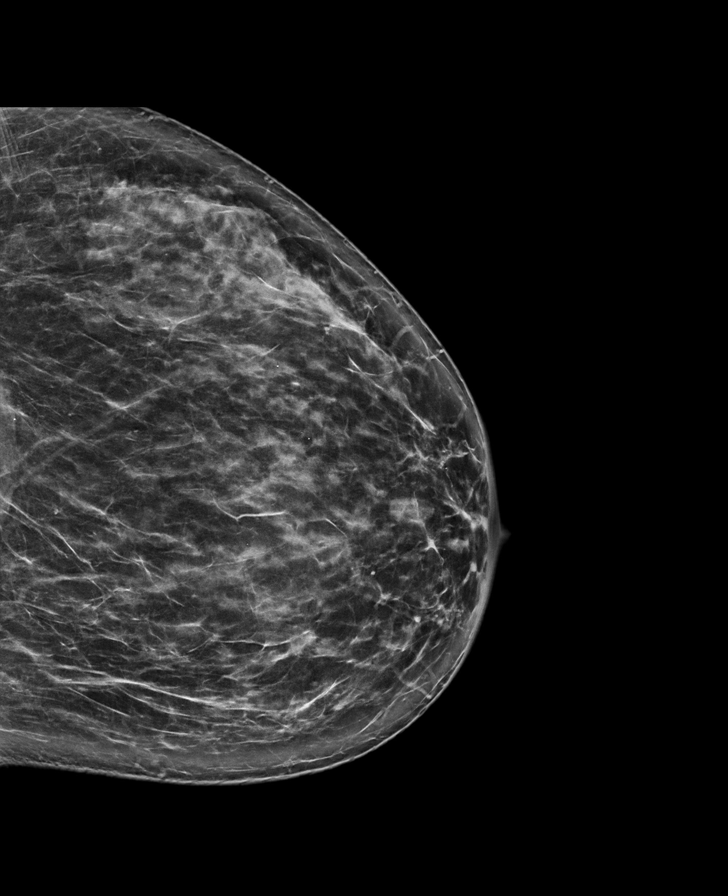

[R MLO synth-2D]
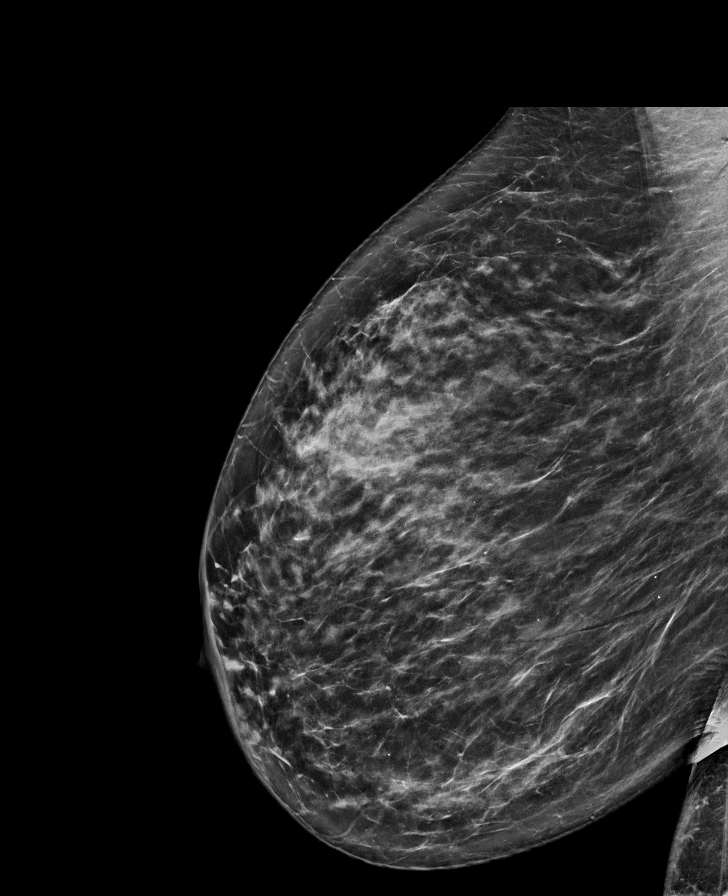

[L MLO synth-2D]
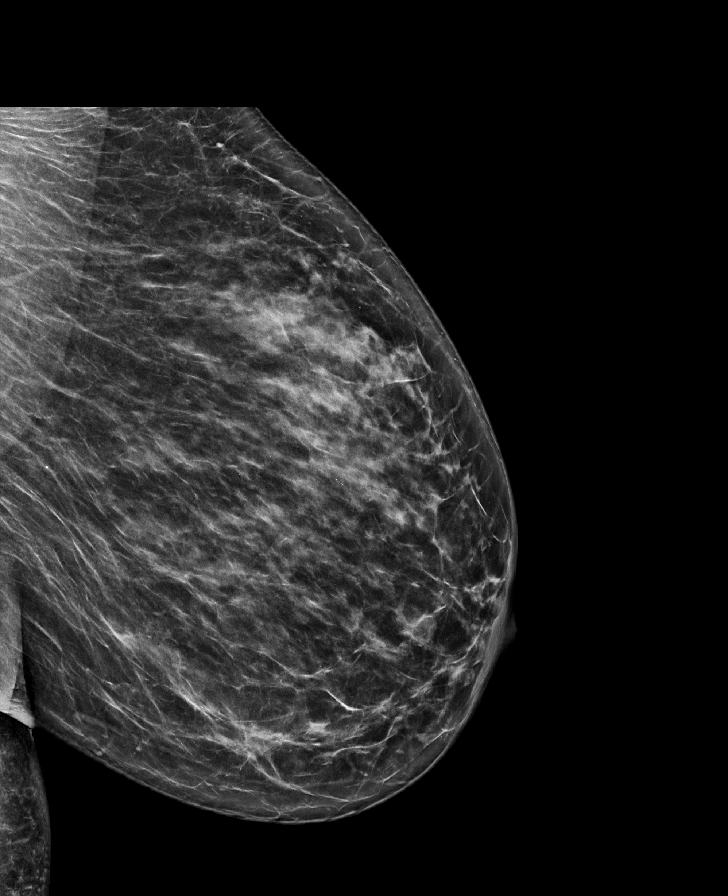

[R CC synth-2D]
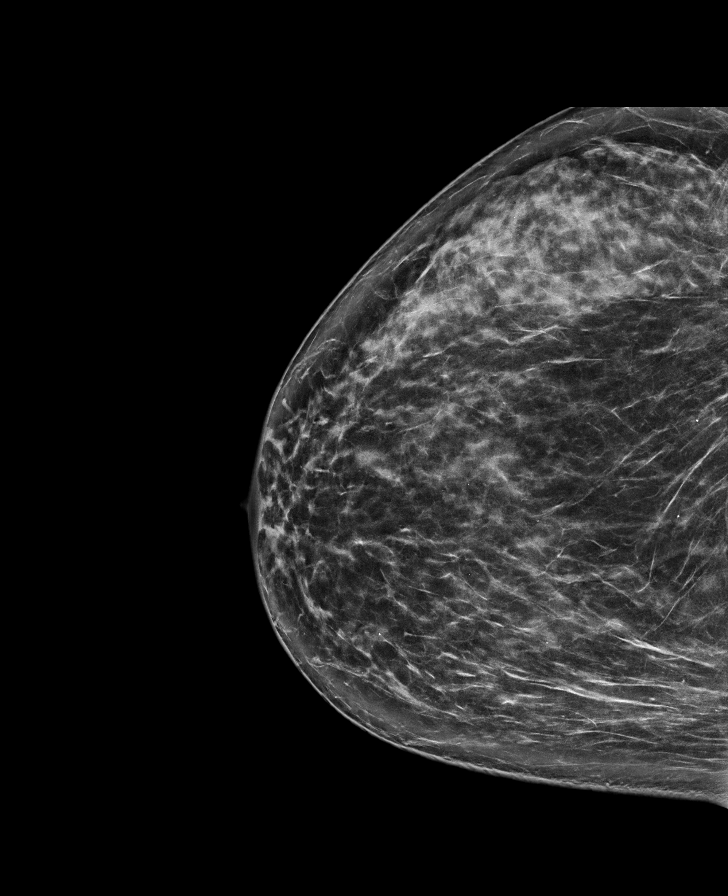

[L MLO tomo · tomo slice 39/77.0]
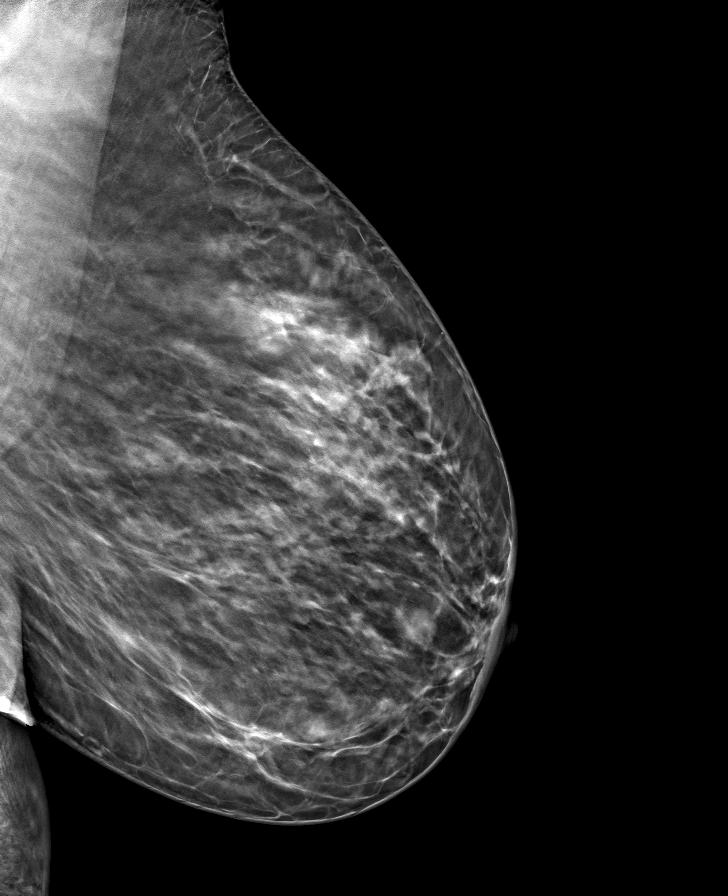

[R MLO tomo · tomo slice 41/82.0]
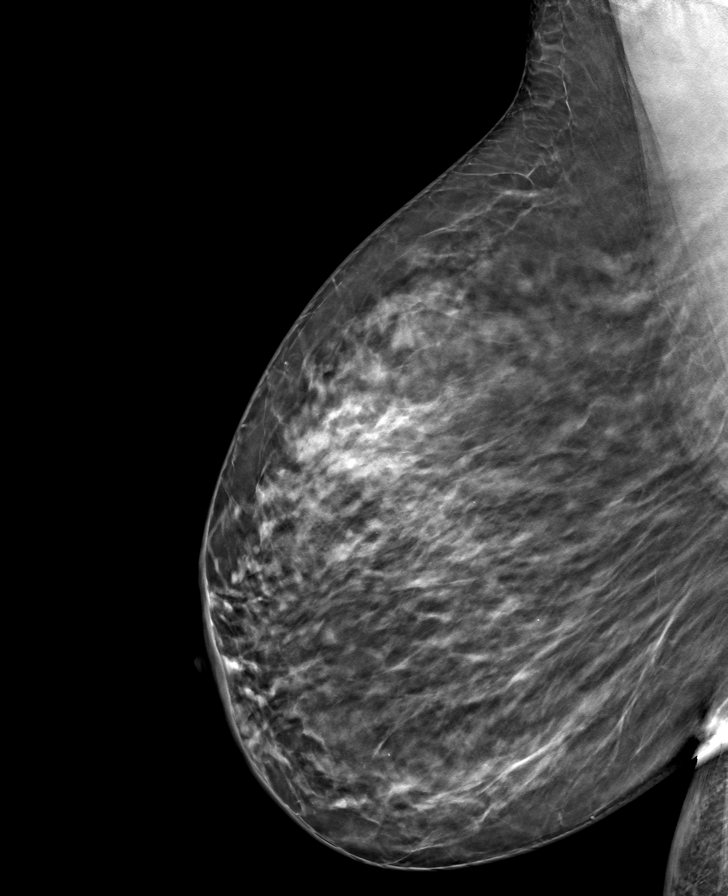

[L CC tomo · tomo slice 40/79.0]
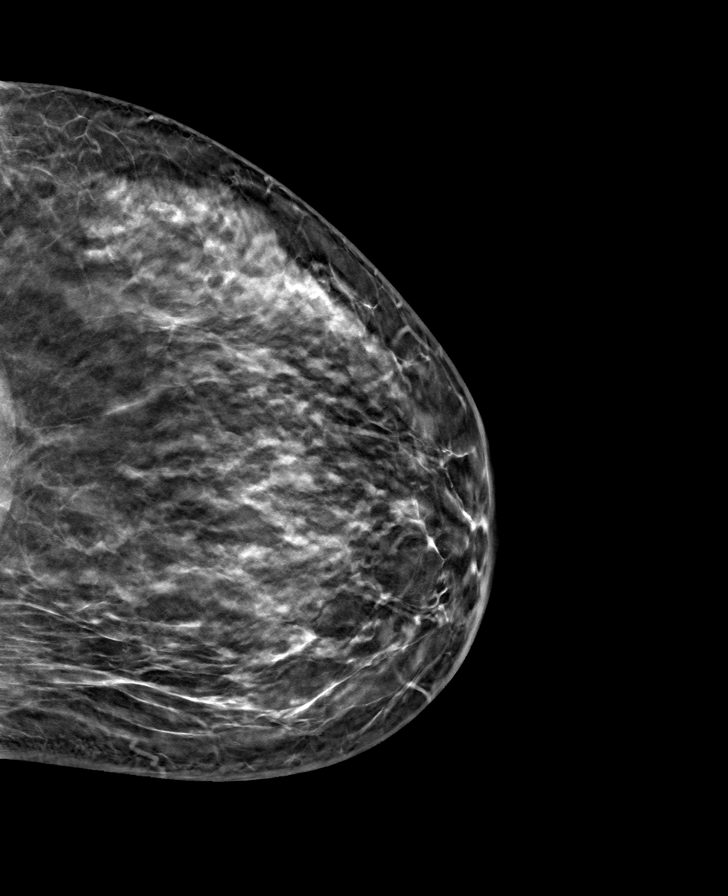

[R CC tomo · tomo slice 43/85.0]
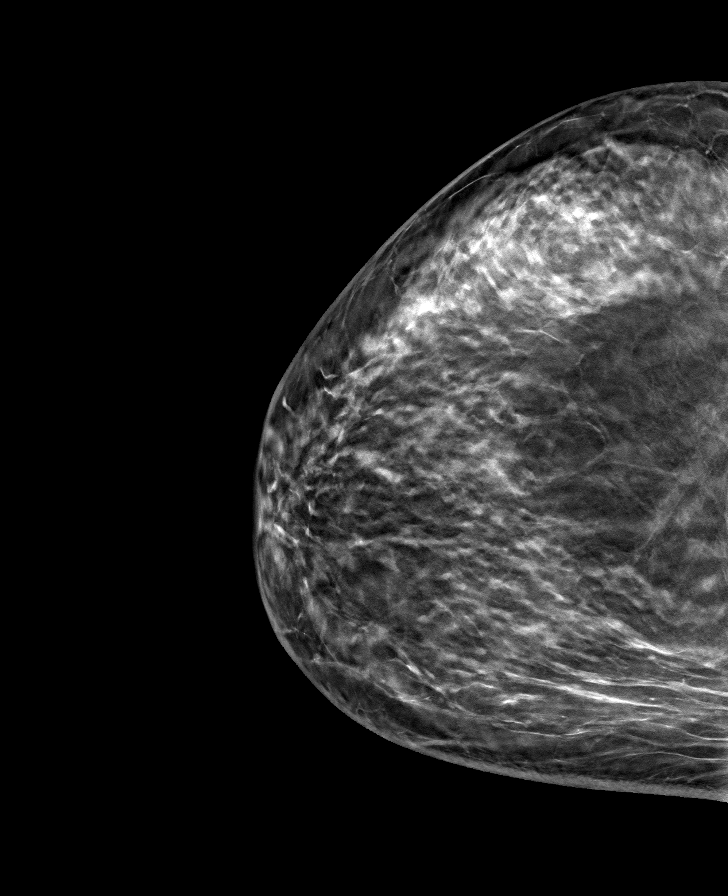

[8 of 24 positions shown; findings below may reference images not displayed]

ACR Breast Density Category b: There are scattered areas of
fibroglandular density.
FINDINGS: There are no findings suspicious for malignancy.
IMPRESSION: No mammographic evidence of malignancy. A result letter of this
screening mammogram will be mailed directly to the patient.

RECOMMENDATION:
Screening mammogram in one year. (Code:51-O-LD2)

BI-RADS CATEGORY  1: Negative.

## 2022-02-20 ENCOUNTER — Ambulatory Visit (INDEPENDENT_AMBULATORY_CARE_PROVIDER_SITE_OTHER): Payer: PRIVATE HEALTH INSURANCE | Admitting: Nurse Practitioner

## 2022-02-20 ENCOUNTER — Other Ambulatory Visit (HOSPITAL_COMMUNITY)
Admission: RE | Admit: 2022-02-20 | Discharge: 2022-02-20 | Disposition: A | Discharge status: Home / Self Care | Payer: PRIVATE HEALTH INSURANCE | Source: Ambulatory Visit | Attending: Nurse Practitioner | Admitting: Nurse Practitioner

## 2022-02-20 VITALS — BP 110/62 | HR 77 | Temp 99.0°F | Ht 64.0 in | Wt 181.0 lb

## 2022-02-20 DIAGNOSIS — Z23 Encounter for immunization: Secondary | ICD-10-CM

## 2022-02-20 DIAGNOSIS — Z0000 Encounter for general adult medical examination without abnormal findings: Secondary | ICD-10-CM | POA: Diagnosis not present

## 2022-02-20 DIAGNOSIS — E559 Vitamin D deficiency, unspecified: Secondary | ICD-10-CM | POA: Diagnosis not present

## 2022-02-20 DIAGNOSIS — R7303 Prediabetes: Secondary | ICD-10-CM | POA: Diagnosis not present

## 2022-02-20 DIAGNOSIS — Z6831 Body mass index (BMI) 31.0-31.9, adult: Secondary | ICD-10-CM

## 2022-02-20 DIAGNOSIS — E041 Nontoxic single thyroid nodule: Secondary | ICD-10-CM

## 2022-02-20 DIAGNOSIS — Z124 Encounter for screening for malignant neoplasm of cervix: Secondary | ICD-10-CM | POA: Insufficient documentation

## 2022-02-20 DIAGNOSIS — E785 Hyperlipidemia, unspecified: Secondary | ICD-10-CM | POA: Diagnosis not present

## 2022-02-20 DIAGNOSIS — Z1211 Encounter for screening for malignant neoplasm of colon: Secondary | ICD-10-CM

## 2022-02-20 DIAGNOSIS — Z79899 Other long term (current) drug therapy: Secondary | ICD-10-CM

## 2022-02-20 DIAGNOSIS — E6609 Other obesity due to excess calories: Secondary | ICD-10-CM

## 2022-02-20 NOTE — Patient Instructions (Signed)

## 2022-02-20 NOTE — Progress Notes (Signed)
I,Tianna Badgett,acting as a Education administrator for [employer], FNP.,have documented all relevant documentation on the behalf of Lori Brine, FNP,as directed by  Lori Brine, FNP while in the presence of Lori Mcdaniel, [place]. pap Subjective:     Patient ID: Lori Mcdaniel , female    DOB: June 18, 1955 , 66 y.o.   MRN: [id number]   Chief Complaint  Patient presents with   Annual Exam    HPI  Here for HM. No current issues or concerns. She had a partial hysterectomy 10 - 15 years   After looking at care everywhere there are notes that the patient was started on antidepressants and has a history of endometrial cancer however she has had a Hysterectomy 10-15 years ago. She has not been to [place]. During the 10/2021 office visit in [place] the patient was in [place] on vacation     Past Medical History:  Diagnosis Date   GERD (gastroesophageal reflux disease)    years ago   Thyroid mass of unclear etiology      Family History  Problem Relation Age of Onset   Diabetes Mother    Alzheimer's disease Mother    Alzheimer's disease Father    Stroke Father    Breast cancer Neg Hx      Current Outpatient Medications:    atorvastatin (LIPITOR) 10 MG tablet, Take 1 tablet (10 mg total) by mouth daily., Disp: 90 tablet, Rfl: 1   metFORMIN (GLUCOPHAGE) 500 MG tablet, Take 1 tablet (500 mg total) by mouth 2 (two) times daily with a meal., Disp: 180 tablet, Rfl: 1   Vitamin D, Ergocalciferol, (DRISDOL) 1.25 MG (50000 UNIT) CAPS capsule, Take 1 capsule (50,000 Units total) by mouth every 7 (seven) days., Disp: 12 capsule, Rfl: 1   Allergies  Allergen Reactions   Penicillins Nausea And Vomiting    UNSPECIFIED REACTION  "Pt has had med in last 10 yrs - was ok to take" Has patient had a PCN reaction causing immediate rash, facial/tongue/throat swelling, SOB or lightheadedness with hypotension: no Has patient had a PCN reaction causing severe rash involving mucus membranes or skin necrosis: no Has  patient had a PCN reaction that required hospitalization: no Has patient had a PCN reaction occurring within the last 10 years: no If all of the above answers are "NO", then may proceed with Cephalos      The patient states she is post menopausal status. No LMP recorded. Patient is postmenopausal.. Negative for Dysmenorrhea and Negative for Menorrhagia. Negative for: breast discharge, breast lump(s), breast pain and breast self exam. Associated symptoms include abnormal vaginal bleeding. Pertinent negatives include abnormal bleeding (hematology), anxiety, decreased libido, depression, difficulty falling sleep, dyspareunia, history of infertility, nocturia, sexual dysfunction, sleep disturbances, urinary incontinence, urinary urgency, vaginal discharge and vaginal itching. Diet regular.The patient states her exercise level is minimal.    . The patient's tobacco use is:  Social History   Tobacco Use  Smoking Status Never  Smokeless Tobacco Never  She has been exposed to passive smoke. The patient's alcohol use is:  Social History   Substance and Sexual Activity  Alcohol Use No    Review of Systems   Today's Vitals   02/20/22 1519  BP: 110/62  Pulse: 77  Temp: 99 F (37.2 C)  TempSrc: Oral  Weight: 181 lb (82.1 kg)  Height: 5' 4" (1.626 m)   Body mass index is 31.07 kg/m.  Wt Readings from Last 3 Encounters:  02/20/22 181 lb (82.1 kg)  08/17/21  177 lb (80.3 kg)  02/14/21 173 lb 3.2 oz (78.6 kg)    Objective:  Physical Exam Vitals reviewed.  Constitutional:      General: She is not in acute distress.    Appearance: Normal appearance. She is well-developed.  HENT:     Head: Normocephalic and atraumatic.     Right Ear: Hearing, tympanic membrane, ear canal and external ear normal. There is no impacted cerumen.     Left Ear: Hearing, tympanic membrane, ear canal and external ear normal. There is no impacted cerumen.     Nose:     Comments: Deferred - masked     Mouth/Throat:     Comments: Deferred - masked Eyes:     General: Lids are normal.     Extraocular Movements: Extraocular movements intact.     Conjunctiva/sclera: Conjunctivae normal.     Pupils: Pupils are equal, round, and reactive to light.     Funduscopic exam:    Right eye: No papilledema.        Left eye: No papilledema.  Neck:     Thyroid: Thyromegaly present. No thyroid mass.     Vascular: No carotid bruit.     Trachea: Trachea normal.  Cardiovascular:     Rate and Rhythm: Normal rate and regular rhythm.     Pulses: Normal pulses.     Heart sounds: Normal heart sounds. No murmur heard. Pulmonary:     Effort: Pulmonary effort is normal. No respiratory distress.     Breath sounds: Normal breath sounds. No wheezing.  Chest:     Chest wall: No mass.  Breasts:    Tanner Score is 5.     Right: Normal. No mass or tenderness.     Left: Normal. No mass or tenderness.  Abdominal:     General: Abdomen is flat. Bowel sounds are normal. There is no distension.     Palpations: Abdomen is soft.     Tenderness: There is no abdominal tenderness.  Genitourinary:    Rectum: Guaiac result negative.  Musculoskeletal:        General: No swelling or tenderness. Normal range of motion.     Cervical back: Full passive range of motion without pain, normal range of motion and neck supple.     Right lower leg: No edema.     Left lower leg: No edema.  Lymphadenopathy:     Upper Body:     Right upper body: No supraclavicular, axillary or pectoral adenopathy.     Left upper body: No supraclavicular, axillary or pectoral adenopathy.  Skin:    General: Skin is warm and dry.     Capillary Refill: Capillary refill takes less than 2 seconds.  Neurological:     General: No focal deficit present.     Mental Status: She is alert and oriented to person, place, and time.     Cranial Nerves: No cranial nerve deficit.     Sensory: No sensory deficit.  Psychiatric:        Mood and Affect: Mood normal.         Behavior: Behavior normal.        Thought Content: Thought content normal.        Judgment: Judgment normal.         Assessment And Plan:     1. Encounter for annual physical exam Behavior modifications discussed and diet history reviewed.   Pt will continue to exercise regularly and modify diet with low GI, plant based  foods and decrease intake of processed foods.  Recommend intake of daily multivitamin, Vitamin D, and calcium.  Recommend mammogram and cologuard for preventive screenings, as well as recommend immunizations that include influenza, TDAP, and Shingles  2. Encounter for Papanicolaou smear of cervix Comments: PAP done no cervix present. - Cytology -Pap Smear  3. Hyperlipidemia, unspecified hyperlipidemia type Comments: Cholesterol levels are normal at last visit. Continue statin, tolerating well. - CMP14+EGFR - Lipid panel  4. Vitamin D deficiency Will check vitamin D level and supplement as needed.    Also encouraged to spend 15 minutes in the sun daily.  - VITAMIN D 25 Hydroxy (Vit-D Deficiency, Fractures)  5. Prediabetes Comments: Stable, continue metformin - Hemoglobin A1c  6. Class 1 obesity due to excess calories without serious comorbidity with body mass index (BMI) of 31.0 to 31.9 in adult She is encouraged to strive for BMI less than 30 to decrease cardiac risk. Advised to aim for at least 150 minutes of exercise per week.  7. Thyroid nodule Comments: Will send for f/u thyroid ultrasound. Pending results will refer back to ENT - US THYROID; Future - TSH + free T4  8. Screen for colon cancer According to USPTF Colorectal cancer Screening guidelines. Colonoscopy is recommended every 3 years, starting at age 3 years. Will refer to GI for colon cancer screening. - Cologuard  9. Other long term (current) drug therapy - CBC  10. Need for influenza vaccination Influenza vaccine administered Encouraged to take Tylenol as needed for fever or  muscle aches. - Flu Vaccine QUAD 6+ mos PF IM (Fluarix Quad PF)   Patient was given opportunity to ask questions. Patient verbalized understanding of the plan and was able to repeat key elements of the plan. All questions were answered to their satisfaction.   Lori Brine, FNP   I, Lori Brine, FNP, have reviewed all documentation for this visit. The documentation on 02/20/22 for the exam, diagnosis, procedures, and orders are all accurate and complete.   THE PATIENT IS ENCOURAGED TO PRACTICE SOCIAL DISTANCING DUE TO THE COVID-19 PANDEMIC.

## 2022-02-21 LAB — HEMOGLOBIN A1C
Est. average glucose Bld gHb Est-mCnc: 126 mg/dL
Hgb A1c MFr Bld: 6 % — ABNORMAL HIGH (ref 4.8–5.6)

## 2022-02-21 LAB — VITAMIN D 25 HYDROXY (VIT D DEFICIENCY, FRACTURES): Vit D, 25-Hydroxy: 30.8 ng/mL (ref 30.0–100.0)

## 2022-02-21 LAB — CBC
Hematocrit: 39.5 % (ref 34.0–46.6)
Hemoglobin: 12.8 g/dL (ref 11.1–15.9)
MCH: 29.3 pg (ref 26.6–33.0)
MCHC: 32.4 g/dL (ref 31.5–35.7)
MCV: 90 fL (ref 79–97)
Platelets: 263 10*3/uL (ref 150–450)
RBC: 4.37 x10E6/uL (ref 3.77–5.28)
RDW: 13 % (ref 11.7–15.4)
WBC: 6 10*3/uL (ref 3.4–10.8)

## 2022-02-21 LAB — LIPID PANEL
Chol/HDL Ratio: 2.5 ratio (ref 0.0–4.4)
Cholesterol, Total: 161 mg/dL (ref 100–199)
HDL: 65 mg/dL (ref >39)
LDL Chol Calc (NIH): 86 mg/dL (ref 0–99)
Triglycerides: 49 mg/dL (ref 0–149)
VLDL Cholesterol Cal: 10 mg/dL (ref 5–40)

## 2022-02-21 LAB — CMP14+EGFR
ALT: 13 IU/L (ref 0–32)
AST: 17 IU/L (ref 0–40)
Albumin/Globulin Ratio: 1.7 (ref 1.2–2.2)
Albumin: 4.3 g/dL (ref 3.9–4.9)
Alkaline Phosphatase: 111 IU/L (ref 44–121)
BUN/Creatinine Ratio: 19 (ref 12–28)
BUN: 16 mg/dL (ref 8–27)
Bilirubin Total: 0.2 mg/dL (ref 0.0–1.2)
CO2: 28 mmol/L (ref 20–29)
Calcium: 9.7 mg/dL (ref 8.7–10.3)
Chloride: 106 mmol/L (ref 96–106)
Creatinine, Ser: 0.86 mg/dL (ref 0.57–1.00)
Globulin, Total: 2.5 g/dL (ref 1.5–4.5)
Glucose: 79 mg/dL (ref 70–99)
Potassium: 4.3 mmol/L (ref 3.5–5.2)
Sodium: 143 mmol/L (ref 134–144)
Total Protein: 6.8 g/dL (ref 6.0–8.5)
eGFR: 76 mL/min/{1.73_m2} (ref >59)

## 2022-02-21 LAB — TSH+FREE T4
Free T4: 1.32 ng/dL (ref 0.82–1.77)
TSH: 1.76 u[IU]/mL (ref 0.450–4.500)

## 2022-02-22 LAB — CYTOLOGY - PAP: Diagnosis: NEGATIVE

## 2022-02-24 ENCOUNTER — Ambulatory Visit
Admission: RE | Admit: 2022-02-24 | Discharge: 2022-02-24 | Disposition: A | Discharge status: Home / Self Care | Payer: PRIVATE HEALTH INSURANCE | Source: Ambulatory Visit | Attending: Nurse Practitioner | Admitting: Nurse Practitioner

## 2022-02-24 DIAGNOSIS — E041 Nontoxic single thyroid nodule: Secondary | ICD-10-CM

## 2022-03-05 ENCOUNTER — Encounter: Payer: Self-pay | Admitting: Nurse Practitioner

## 2022-03-07 LAB — COLOGUARD: COLOGUARD: NEGATIVE

## 2022-03-08 ENCOUNTER — Other Ambulatory Visit: Payer: Self-pay | Admitting: Nurse Practitioner

## 2022-03-08 DIAGNOSIS — E041 Nontoxic single thyroid nodule: Secondary | ICD-10-CM

## 2022-03-10 ENCOUNTER — Other Ambulatory Visit: Payer: Self-pay | Admitting: Nurse Practitioner

## 2022-04-18 ENCOUNTER — Other Ambulatory Visit: Payer: Self-pay | Admitting: Nurse Practitioner

## 2022-04-18 DIAGNOSIS — R7303 Prediabetes: Secondary | ICD-10-CM

## 2022-04-20 ENCOUNTER — Telehealth: Payer: Self-pay

## 2022-04-20 ENCOUNTER — Other Ambulatory Visit: Payer: Self-pay

## 2022-04-20 NOTE — Telephone Encounter (Signed)
Left pt vm to give the office a call back to sch an appointment. Last seen in oct.

## 2022-05-01 ENCOUNTER — Other Ambulatory Visit: Payer: Self-pay

## 2022-05-01 MED ORDER — ATORVASTATIN CALCIUM 10 MG PO TABS: 10.0000 mg | ORAL_TABLET | Freq: Every day | ORAL | 1 refills | Status: DC

## 2022-05-09 ENCOUNTER — Ambulatory Visit (INDEPENDENT_AMBULATORY_CARE_PROVIDER_SITE_OTHER): Payer: PRIVATE HEALTH INSURANCE

## 2022-05-09 ENCOUNTER — Ambulatory Visit
Admission: EM | Admit: 2022-05-09 | Discharge: 2022-05-09 | Disposition: A | Discharge status: Home / Self Care | Payer: PRIVATE HEALTH INSURANCE | Attending: Urgent Care | Admitting: Urgent Care

## 2022-05-09 DIAGNOSIS — M7521 Bicipital tendinitis, right shoulder: Secondary | ICD-10-CM

## 2022-05-09 MED ORDER — MELOXICAM 7.5 MG PO TABS
7.5000 mg | ORAL_TABLET | Freq: Every day | ORAL | 0 refills | Status: DC
Start: 2022-05-09 — End: 2022-08-22

## 2022-05-09 NOTE — ED Triage Notes (Signed)
Pt c/o right shoulder pain that began earlier last week.  Home interventions: tylenol

## 2022-05-09 NOTE — ED Provider Notes (Signed)
[name]  Note:  This document was prepared using Systems analyst and may include unintentional dictation errors.  MRN: [id number] DOB: 15-Feb-1956  Subjective:   Lori Mcdaniel is a 67 y.o. female presenting for 1 week history of acute onset persistent right shoulder pain. No trauma, fall, bruising, redness, warmth or erythema. Reports that a family member told her the shoulder looked swollen. Has tried Tylenol with minimal relief. No history of heart disease, ckd.   No current facility-administered medications for this encounter.  Current Outpatient Medications:    atorvastatin (LIPITOR) 10 MG tablet, Take 1 tablet (10 mg total) by mouth daily., Disp: 90 tablet, Rfl: 1   metFORMIN (GLUCOPHAGE) 500 MG tablet, TAKE 1 TABLET(500 MG) BY MOUTH TWICE DAILY WITH A MEAL, Disp: 180 tablet, Rfl: 1   Vitamin D, Ergocalciferol, (DRISDOL) 1.25 MG (50000 UNIT) CAPS capsule, TAKE 1 CAPSULE BY MOUTH EVERY 7 DAYS, Disp: 12 capsule, Rfl: 1   Allergies  Allergen Reactions   Penicillins Nausea And Vomiting    UNSPECIFIED REACTION  "Pt has had med in last 10 yrs - was ok to take" Has patient had a PCN reaction causing immediate rash, facial/tongue/throat swelling, SOB or lightheadedness with hypotension: no Has patient had a PCN reaction causing severe rash involving mucus membranes or skin necrosis: no Has patient had a PCN reaction that required hospitalization: no Has patient had a PCN reaction occurring within the last 10 years: no If all of the above answers are "NO", then may proceed with Cephalos    Past Medical History:  Diagnosis Date   GERD (gastroesophageal reflux disease)    years ago   Thyroid mass of unclear etiology      Past Surgical History:  Procedure Laterality Date   ABDOMINAL HYSTERECTOMY     BREAST CYST EXCISION Left 1973   CESAREAN SECTION     COLONOSCOPY     KELOID EXCISION Bilateral    "ears"   THYROIDECTOMY Right  06/27/2017   THYROIDECTOMY Right 06/27/2017   Procedure: THYROIDECTOMY;  Surgeon: Izora Gala, MD;  Location: [facility] OR;  Service: ENT;  Laterality: Right;  Right Thyroid lobectomy, frozen section possible total    Family History  Problem Relation Age of Onset   Diabetes Mother    Alzheimer's disease Mother    Alzheimer's disease Father    Stroke Father    Breast cancer Neg Hx     Social History   Tobacco Use   Smoking status: Never   Smokeless tobacco: Never  Vaping Use   Vaping Use: Never used  Substance Use Topics   Alcohol use: No   Drug use: No    ROS   Objective:   Vitals: BP (!) 159/76 (BP Location: Left Arm)   Pulse 74   Temp 98.4 F (36.9 C) (Oral)   Resp 16   SpO2 98%   Physical Exam Constitutional:      General: She is not in acute distress.    Appearance: Normal appearance. She is well-developed. She is not ill-appearing, toxic-appearing or diaphoretic.  HENT:     Head: Normocephalic and atraumatic.     Nose: Nose normal.     Mouth/Throat:     Mouth: Mucous membranes are moist.  Eyes:     General: No scleral icterus.       Right eye: No discharge.        Left eye: No discharge.     Extraocular Movements: Extraocular movements intact.  Cardiovascular:     Rate and Rhythm: Normal rate.  Pulmonary:     Effort: Pulmonary effort is normal.  Musculoskeletal:     Right shoulder: Tenderness (anterior deltoid, directly over biceps tendon) present. No swelling, deformity, effusion, laceration, bony tenderness or crepitus. Normal range of motion. Normal strength.  Skin:    General: Skin is warm and dry.  Neurological:     General: No focal deficit present.     Mental Status: She is alert and oriented to person, place, and time.  Psychiatric:        Mood and Affect: Mood normal.        Behavior: Behavior normal.     DG Shoulder Right  Result Date: 05/09/2022 CLINICAL DATA:  Right-sided shoulder pain that began last week EXAM: RIGHT SHOULDER-for VIEW  COMPARISON:  None Available. FINDINGS: No fracture or dislocation. Mild degenerative changes of the [facility] joint. There is some joint space loss as well as the glenohumeral joint. Preserved bone mineralization. IMPRESSION: Mild degenerative changes. Electronically Signed   By: Jill Side M.D.   On: 05/09/2022 16:26    Assessment and Plan :   PDMP not reviewed this encounter.  1. Biceps tendonitis, right     Will manage for biceps tendonitis. Recommended follow up with an orthopedist. X-ray over-read was pending at time of discharge, recommended follow up with only abnormal results. Otherwise will not call for negative over-read. Patient was in agreement. Counseled patient on potential for adverse effects with medications prescribed/recommended today, ER and return-to-clinic precautions discussed, patient verbalized understanding.    Jaynee Eagles, PA-C 05/09/22 2310

## 2022-07-11 ENCOUNTER — Other Ambulatory Visit: Payer: Self-pay | Admitting: Otolaryngology

## 2022-07-11 DIAGNOSIS — E079 Disorder of thyroid, unspecified: Secondary | ICD-10-CM

## 2022-08-22 ENCOUNTER — Encounter: Payer: Self-pay | Admitting: Nurse Practitioner

## 2022-08-22 ENCOUNTER — Ambulatory Visit: Payer: PRIVATE HEALTH INSURANCE | Admitting: Nurse Practitioner

## 2022-08-22 VITALS — BP 116/80 | HR 84 | Temp 98.4°F | Ht 64.0 in | Wt 172.8 lb

## 2022-08-22 DIAGNOSIS — E890 Postprocedural hypothyroidism: Secondary | ICD-10-CM

## 2022-08-22 DIAGNOSIS — M7660 Achilles tendinitis, unspecified leg: Secondary | ICD-10-CM | POA: Diagnosis not present

## 2022-08-22 DIAGNOSIS — E559 Vitamin D deficiency, unspecified: Secondary | ICD-10-CM

## 2022-08-22 DIAGNOSIS — R7303 Prediabetes: Secondary | ICD-10-CM

## 2022-08-22 DIAGNOSIS — E785 Hyperlipidemia, unspecified: Secondary | ICD-10-CM | POA: Diagnosis not present

## 2022-08-22 DIAGNOSIS — Z6829 Body mass index (BMI) 29.0-29.9, adult: Secondary | ICD-10-CM

## 2022-08-22 DIAGNOSIS — Z23 Encounter for immunization: Secondary | ICD-10-CM

## 2022-08-22 MED ORDER — VITAMIN D (ERGOCALCIFEROL) 1.25 MG (50000 UNIT) PO CAPS: 50000.0000 [IU] | ORAL_CAPSULE | ORAL | 1 refills | Status: DC

## 2022-08-22 NOTE — Progress Notes (Signed)
Lori Mcdaniel,acting as a Neurosurgeon for Lori Felts, FNP.,have documented all relevant documentation on the behalf of Lori Felts, FNP,as directed by  Lori Felts, FNP while in the presence of Lori Felts, FNP.    Subjective:     Patient ID: Lori Mcdaniel , female    DOB: 10-11-1955 , 67 y.o.   MRN: [id number]   Chief Complaint  Patient presents with   Hyperlipidemia    HPI  Patient presents today for a f/u on her prediabetes,vitamin d and chol. Patient reports compliance with her meds. She has been to [facility] her right shoulder dx with tendoniitis was referred to PT but unable to afford it so has been doing exercises on her own. She has seem ENT for her thyroid and is to have another ultrasound due to nodule being in the front more noticeable but he does not think any concern but will recheck.   Her sister is staying with her and eating more healthy. She is eating more fruits and vegetables.   Wt Readings from Last 3 Encounters: 08/22/22 : 172 lb 12.8 oz (78.4 kg) 02/20/22 : 181 lb (82.1 kg) 08/17/21 : 177 lb (80.3 kg)    Hyperlipidemia This is a chronic problem. The current episode started more than 1 year ago. The problem is controlled. Recent lipid tests were reviewed and are normal. There are no known factors aggravating her hyperlipidemia. There are no compliance problems.      Past Medical History:  Diagnosis Date   GERD (gastroesophageal reflux disease)    years ago   Thyroid mass of unclear etiology      Family History  Problem Relation Age of Onset   Diabetes Mother    Alzheimer's disease Mother    Alzheimer's disease Father    Stroke Father    Breast cancer Neg Hx      Current Outpatient Medications:    atorvastatin (LIPITOR) 10 MG tablet, Take 1 tablet (10 mg total) by mouth daily., Disp: 90 tablet, Rfl: 1   metFORMIN (GLUCOPHAGE) 500 MG tablet, TAKE 1 TABLET(500 MG) BY MOUTH TWICE DAILY WITH A MEAL, Disp: 180 tablet, Rfl: 1   Vitamin  D, Ergocalciferol, (DRISDOL) 1.25 MG (50000 UNIT) CAPS capsule, Take 1 capsule (50,000 Units total) by mouth every 7 (seven) days., Disp: 12 capsule, Rfl: 1   Allergies  Allergen Reactions   Penicillins Nausea And Vomiting    UNSPECIFIED REACTION  "Pt has had med in last 10 yrs - was ok to take" Has patient had a PCN reaction causing immediate rash, facial/tongue/throat swelling, SOB or lightheadedness with hypotension: no Has patient had a PCN reaction causing severe rash involving mucus membranes or skin necrosis: no Has patient had a PCN reaction that required hospitalization: no Has patient had a PCN reaction occurring within the last 10 years: no If all of the above answers are "NO", then may proceed with Cephalos     Review of Systems  Constitutional: Negative.   Eyes: Negative.   Endocrine: Negative for polydipsia, polyphagia and polyuria.  Musculoskeletal: Negative.   Skin: Negative.   Psychiatric/Behavioral: Negative.       Today's Vitals   08/22/22 1549  BP: 116/80  Pulse: 84  Temp: 98.4 F (36.9 C)  Weight: 172 lb 12.8 oz (78.4 kg)  Height: 5\' 4"  (1.626 m)  PainSc: 0-No pain   Body mass index is 29.66 kg/m.   Objective:  Physical Exam Vitals reviewed.  Constitutional:  General: She is not in acute distress.    Appearance: Normal appearance. She is obese.  Cardiovascular:     Rate and Rhythm: Normal rate and regular rhythm.     Pulses: Normal pulses.     Heart sounds: Normal heart sounds. No murmur heard. Pulmonary:     Effort: Pulmonary effort is normal. No respiratory distress.     Breath sounds: Normal breath sounds. No wheezing.  Skin:    General: Skin is warm and dry.     Findings: No bruising.  Neurological:     General: No focal deficit present.     Mental Status: She is alert and oriented to person, place, and time.     Cranial Nerves: No cranial nerve deficit.     Motor: No weakness.  Psychiatric:        Mood and Affect: Mood normal.         Behavior: Behavior normal.        Thought Content: Thought content normal.        Judgment: Judgment normal.         Assessment And Plan:     1. Prediabetes Comments: HgbA1c was slightly improved at last visit, continue eating diet low in sugar and carbohydrates. - Hemoglobin A1c  2. Hyperlipidemia, unspecified hyperlipidemia type Comments: Cholesterol levels are stable, continue focusing on diet low in fat - Lipid panel  3. Vitamin D deficiency Will check vitamin D level and supplement as needed.    Also encouraged to spend 15 minutes in the sun daily.  - Vitamin D (25 hydroxy) - Vitamin D, Ergocalciferol, (DRISDOL) 1.25 MG (50000 UNIT) CAPS capsule; Take 1 capsule (50,000 Units total) by mouth every 7 (seven) days.  Dispense: 12 capsule; Refill: 1  4. Achilles tendon pain Comments: No recent antibiotic use. Tenderness to achilles, negative for foot drop. If worsens will need to check xray or refer to podiatry  5. BMI 29.0-29.9,adult  6. Need for shingles vaccine Shingrix #2 given in office.  - Varicella-zoster vaccine IM  7. S/P partial thyroidectomy - TSH + free T4     Patient was given opportunity to ask questions. Patient verbalized understanding of the plan and was able to repeat key elements of the plan. All questions were answered to their satisfaction.  Lori Felts, FNP   I, Lori Felts, FNP, have reviewed all documentation for this visit. The documentation on 08/22/22 for the exam, diagnosis, procedures, and orders are all accurate and complete.   IF YOU HAVE BEEN REFERRED TO A SPECIALIST, IT MAY TAKE 1-2 WEEKS TO SCHEDULE/PROCESS THE REFERRAL. IF YOU HAVE NOT HEARD FROM US/SPECIALIST IN TWO WEEKS, PLEASE GIVE [place] A CALL AT [phone].   THE PATIENT IS ENCOURAGED TO PRACTICE SOCIAL DISTANCING DUE TO THE COVID-19 PANDEMIC.

## 2022-08-22 NOTE — Patient Instructions (Signed)

## 2022-08-23 LAB — LIPID PANEL
Chol/HDL Ratio: 2.4 ratio (ref 0.0–4.4)
Cholesterol, Total: 161 mg/dL (ref 100–199)
HDL: 67 mg/dL (ref >39)
LDL Chol Calc (NIH): 84 mg/dL (ref 0–99)
Triglycerides: 50 mg/dL (ref 0–149)
VLDL Cholesterol Cal: 10 mg/dL (ref 5–40)

## 2022-08-23 LAB — TSH+FREE T4
Free T4: 1.39 ng/dL (ref 0.82–1.77)
TSH: 1.39 u[IU]/mL (ref 0.450–4.500)

## 2022-08-23 LAB — HEMOGLOBIN A1C
Est. average glucose Bld gHb Est-mCnc: 123 mg/dL
Hgb A1c MFr Bld: 5.9 % — ABNORMAL HIGH (ref 4.8–5.6)

## 2022-08-23 LAB — VITAMIN D 25 HYDROXY (VIT D DEFICIENCY, FRACTURES): Vit D, 25-Hydroxy: 47.1 ng/mL (ref 30.0–100.0)

## 2022-09-15 ENCOUNTER — Ambulatory Visit
Admission: RE | Admit: 2022-09-15 | Discharge: 2022-09-15 | Disposition: A | Discharge status: Home / Self Care | Payer: PRIVATE HEALTH INSURANCE | Source: Ambulatory Visit | Attending: Otolaryngology | Admitting: Otolaryngology

## 2022-09-15 DIAGNOSIS — E079 Disorder of thyroid, unspecified: Secondary | ICD-10-CM

## 2022-10-31 ENCOUNTER — Other Ambulatory Visit: Payer: Self-pay | Admitting: Nurse Practitioner

## 2022-11-20 ENCOUNTER — Ambulatory Visit (INDEPENDENT_AMBULATORY_CARE_PROVIDER_SITE_OTHER): Payer: PRIVATE HEALTH INSURANCE

## 2022-11-20 ENCOUNTER — Encounter: Payer: Self-pay | Admitting: Podiatry

## 2022-11-20 ENCOUNTER — Ambulatory Visit (INDEPENDENT_AMBULATORY_CARE_PROVIDER_SITE_OTHER): Payer: PRIVATE HEALTH INSURANCE | Admitting: Podiatry

## 2022-11-20 DIAGNOSIS — M25571 Pain in right ankle and joints of right foot: Secondary | ICD-10-CM

## 2022-11-20 DIAGNOSIS — M7661 Achilles tendinitis, right leg: Secondary | ICD-10-CM | POA: Diagnosis not present

## 2022-11-20 MED ORDER — MELOXICAM 15 MG PO TABS: 15.0000 mg | ORAL_TABLET | Freq: Every day | ORAL | 0 refills | Status: DC

## 2022-11-20 NOTE — Patient Instructions (Signed)

## 2022-11-20 NOTE — Progress Notes (Signed)
  Subjective:  Patient ID: Lori Mcdaniel, female    DOB: June 06, 1955,   MRN: [id number]  No chief complaint on file.   67 y.o. female presents for concern of right ankle pain that has been going on for about a month. Relates she recently got new steel toes at work and the pain started after that. Relates the pain is in the back of the heel and now starting to hurt anytime she is walking in any shoes. She has tried padding on the bottom of her shoes without relief.   Patient is diabetic and last A1c was  Lab Results  Component Value Date   HGBA1C 5.9 (H) 08/22/2022   .   PCP:  [name], FNP    . Denies any other pedal complaints. Denies n/v/f/c.   Past Medical History:  Diagnosis Date   GERD (gastroesophageal reflux disease)    years ago   Thyroid mass of unclear etiology     Objective:  Physical Exam: Vascular: DP/PT pulses 2/4 bilateral. CFT <3 seconds. Normal hair growth on digits. No edema.  Skin. No lacerations or abrasions bilateral feet.  Musculoskeletal: MMT 5/5 bilateral lower extremities in DF, PF, Inversion and Eversion. Deceased ROM in DF of ankle joint. Tender to right achilles tendon insertion noted. Pain wit PF and DF. Some pain into the watershed area of hte achilles. No pain in PT or medial calcaneal tubercle.  Neurological: Sensation intact to light touch.   Assessment:   1. Tendonitis, Achilles, right      Plan:  Patient was evaluated and treated and all questions answered. X-rays reviewed and discussed with patient. No acute fractures or dislocations noted. Significant degenerative changes noted in the midfoot. Some changes noted in the subtalar joints and ankle joint as well. Spurring noted to inferior and posterior calcaneus.  -Discussed Achilles insertional tendonitis and treatment options with patient.  -Discussed stretching exercises. -Rx Meloxicam provided. Most recent CMP Kidney function in normal range.  -Heel lifts provided and discussed  proper shoewear.  -Discussed if no improvement will consider MRI/PT/EPAT/PRP injections.  -Patient to return to office in 6 weeks for recheck.    Louann Sjogren, DPM

## 2022-12-17 ENCOUNTER — Other Ambulatory Visit: Payer: Self-pay | Admitting: Podiatry

## 2023-01-03 ENCOUNTER — Ambulatory Visit (INDEPENDENT_AMBULATORY_CARE_PROVIDER_SITE_OTHER): Payer: PRIVATE HEALTH INSURANCE | Admitting: Podiatry

## 2023-01-03 ENCOUNTER — Encounter: Payer: Self-pay | Admitting: Podiatry

## 2023-01-03 DIAGNOSIS — M7661 Achilles tendinitis, right leg: Secondary | ICD-10-CM

## 2023-01-03 NOTE — Patient Instructions (Signed)

## 2023-01-03 NOTE — Progress Notes (Signed)
  Subjective:  Patient ID: Lori Mcdaniel, female    DOB: 06-Dec-1955,   MRN: [id number]  Chief Complaint  Patient presents with   Tendonitis    Achilles Tendinitis,right foot is much better    67 y.o. female presents for follow-up of right achilles tendonitis. Relates almost 100% better. Relates occasional soreness. The heel lifts have been very helpful and has been stretching some. Relates the meloxicam helped as well.   Patient is diabetic and last A1c was  Lab Results  Component Value Date   HGBA1C 5.9 (H) 08/22/2022   .   PCP:  [name], FNP    . Denies any other pedal complaints. Denies n/v/f/c.   Past Medical History:  Diagnosis Date   GERD (gastroesophageal reflux disease)    years ago   Thyroid mass of unclear etiology     Objective:  Physical Exam: Vascular: DP/PT pulses 2/4 bilateral. CFT <3 seconds. Normal hair growth on digits. No edema.  Skin. No lacerations or abrasions bilateral feet.  Musculoskeletal: MMT 5/5 bilateral lower extremities in DF, PF, Inversion and Eversion. Deceased ROM in DF of ankle joint. Minimally tender to right achilles tendon insertion noted. Mild pain wit PF and DF. Some pain into the watershed area of the achilles. No pain in PT or medial calcaneal tubercle.  Neurological: Sensation intact to light touch.   Assessment:   1. Tendonitis, Achilles, right       Plan:  Patient was evaluated and treated and all questions answered. X-rays reviewed and discussed with patient. No acute fractures or dislocations noted. Significant degenerative changes noted in the midfoot. Some changes noted in the subtalar joints and ankle joint as well. Spurring noted to inferior and posterior calcaneus.  -Discussed Achilles insertional tendonitis and treatment options with patient.  -Continue stretching and anti-inflammatories as needed.  Continue heel lifts.  -Discussed if no improvement will consider MRI/PT/EPAT/PRP injections.  -Patient to return  to office as needed   Louann Sjogren, DPM

## 2023-02-01 ENCOUNTER — Other Ambulatory Visit: Payer: Self-pay

## 2023-02-01 DIAGNOSIS — R7303 Prediabetes: Secondary | ICD-10-CM

## 2023-02-01 MED ORDER — METFORMIN HCL 500 MG PO TABS: 500.0000 mg | ORAL_TABLET | Freq: Two times a day (BID) | ORAL | 1 refills | Status: DC

## 2023-02-09 ENCOUNTER — Other Ambulatory Visit: Payer: Self-pay | Admitting: Nurse Practitioner

## 2023-02-09 DIAGNOSIS — E559 Vitamin D deficiency, unspecified: Secondary | ICD-10-CM

## 2023-02-26 ENCOUNTER — Encounter: Payer: Self-pay | Admitting: Nurse Practitioner

## 2023-02-26 ENCOUNTER — Ambulatory Visit (INDEPENDENT_AMBULATORY_CARE_PROVIDER_SITE_OTHER): Payer: PRIVATE HEALTH INSURANCE | Admitting: Nurse Practitioner

## 2023-02-26 VITALS — BP 130/70 | HR 68 | Temp 98.2°F | Ht 64.0 in | Wt 171.8 lb

## 2023-02-26 DIAGNOSIS — R7303 Prediabetes: Secondary | ICD-10-CM | POA: Diagnosis not present

## 2023-02-26 DIAGNOSIS — Z23 Encounter for immunization: Secondary | ICD-10-CM | POA: Diagnosis not present

## 2023-02-26 DIAGNOSIS — E785 Hyperlipidemia, unspecified: Secondary | ICD-10-CM | POA: Diagnosis not present

## 2023-02-26 DIAGNOSIS — E559 Vitamin D deficiency, unspecified: Secondary | ICD-10-CM | POA: Insufficient documentation

## 2023-02-26 DIAGNOSIS — R002 Palpitations: Secondary | ICD-10-CM | POA: Diagnosis not present

## 2023-02-26 DIAGNOSIS — Z2821 Immunization not carried out because of patient refusal: Secondary | ICD-10-CM

## 2023-02-26 DIAGNOSIS — Z0000 Encounter for general adult medical examination without abnormal findings: Secondary | ICD-10-CM

## 2023-02-26 DIAGNOSIS — Z79899 Other long term (current) drug therapy: Secondary | ICD-10-CM

## 2023-02-26 DIAGNOSIS — F4321 Adjustment disorder with depressed mood: Secondary | ICD-10-CM

## 2023-02-26 NOTE — Patient Instructions (Addendum)
[facility] call them to see how much atorvastatin Phone: [phone] You can take a magnesium supplement to help with relaxation every evening.

## 2023-02-26 NOTE — Progress Notes (Signed)
[name], CMA,acting as a Neurosurgeon for Lori Felts, FNP.,have documented all relevant documentation on the behalf of Lori Felts, FNP,as directed by  Lori Felts, FNP while in the presence of Lori Felts, FNP.  Subjective:    Patient ID: Lori Mcdaniel , female    DOB: 1955-08-01 , 67 y.o.   MRN: [id number]  Chief Complaint  Patient presents with   Annual Exam    HPI  Patient presents today for HM, Patient reports compliance with medication. Patient denies any chest pain, SOB, or headaches. Patient reports she has been palpations for about a week- she reports it happens 2/3 a day about every other day. She began feeling when her husband was in the ICU.  She had this in the past with drinking coffee and had stopped.  Occasionally would have some lightheaded would come and go.  She does report her husband passed away 31-Oct-2024and it could be stress. She had been out all week from work. She feels like she needs to get back to work to normalize. She does have a number for a grief counselor.   She has been to her foot doctor and now has heel lifts for her steel toe shoes.   BP Readings from Last 3 Encounters: 02/26/23 : (!) 140/68 08/22/22 : 116/80 05/09/22 : Marland Kitchen 159/76       Past Medical History:  Diagnosis Date   GERD (gastroesophageal reflux disease)    years ago   Thyroid mass of unclear etiology      Family History  Problem Relation Age of Onset   Diabetes Mother    Alzheimer's disease Mother    Alzheimer's disease Father    Stroke Father    Breast cancer Neg Hx      Current Outpatient Medications:    atorvastatin (LIPITOR) 10 MG tablet, TAKE 1 TABLET(10 MG) BY MOUTH DAILY, Disp: 90 tablet, Rfl: 1   meloxicam (MOBIC) 15 MG tablet, TAKE 1 TABLET(15 MG) BY MOUTH DAILY, Disp: 30 tablet, Rfl: 0   metFORMIN (GLUCOPHAGE) 500 MG tablet, Take 1 tablet (500 mg total) by mouth 2 (two) times daily with a meal., Disp: 180 tablet, Rfl: 1   amLODipine (NORVASC) 2.5 MG  tablet, Take 1 tablet (2.5 mg total) by mouth daily., Disp: 30 tablet, Rfl: 2   Allergies  Allergen Reactions   Penicillins Nausea And Vomiting    UNSPECIFIED REACTION  "Pt has had med in last 10 yrs - was ok to take" Has patient had a PCN reaction causing immediate rash, facial/tongue/throat swelling, SOB or lightheadedness with hypotension: no Has patient had a PCN reaction causing severe rash involving mucus membranes or skin necrosis: no Has patient had a PCN reaction that required hospitalization: no Has patient had a PCN reaction occurring within the last 10 years: no If all of the above answers are "NO", then may proceed with Cephalos      The patient states she uses status post hysterectomy.  No LMP recorded. Patient is postmenopausal.  Negative for: breast discharge, breast lump(s), breast pain and breast self exam. Associated symptoms include abnormal vaginal bleeding. Pertinent negatives include abnormal bleeding (hematology), anxiety, decreased libido, depression, difficulty falling sleep, dyspareunia, history of infertility, nocturia, sexual dysfunction, sleep disturbances, urinary incontinence, urinary urgency, vaginal discharge and vaginal itching. Diet regular; better, she is not as interested in meats as much. She is eating more fruits The patient states her exercise level is minimal - bowling she has been mostly working due  to her husband had been sick. 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 The patient's alcohol use is:  Social History   Substance and Sexual Activity  Alcohol Use No    Review of Systems  Constitutional: Negative.   HENT: Negative.    Eyes: Negative.   Respiratory: Negative.    Cardiovascular:  Positive for palpitations.  Gastrointestinal: Negative.   Endocrine: Negative.   Genitourinary: Negative.    Musculoskeletal: Negative.   Skin: Negative.   Allergic/Immunologic: Negative.   Neurological: Negative.   Hematological: Negative.   Psychiatric/Behavioral: Negative.       Today's Vitals   02/26/23 1458 02/26/23 1510  BP: (!) 140/68 130/70  Pulse: 68   Temp: 98.2 F (36.8 C)   TempSrc: Oral   Weight: 171 lb 12.8 oz (77.9 kg)   Height: 5\' 4"  (1.626 m)   PainSc: 0-No pain    Body mass index is 29.49 kg/m.  Wt Readings from Last 3 Encounters:  03/07/23 174 lb 6.4 oz (79.1 kg)  02/26/23 171 lb 12.8 oz (77.9 kg)  08/22/22 172 lb 12.8 oz (78.4 kg)     Objective:  Physical Exam Vitals reviewed.  Constitutional:      General: She is not in acute distress.    Appearance: Normal appearance. She is well-developed.  HENT:     Head: Normocephalic and atraumatic.     Right Ear: Hearing, tympanic membrane, ear canal and external ear normal. There is no impacted cerumen.     Left Ear: Hearing, tympanic membrane, ear canal and external ear normal. There is no impacted cerumen.     Nose: Nose normal.     Mouth/Throat:     Mouth: Mucous membranes are moist.  Eyes:     General: Lids are normal.     Extraocular Movements: Extraocular movements intact.     Conjunctiva/sclera: Conjunctivae normal.     Pupils: Pupils are equal, round, and reactive to light.     Funduscopic exam:    Right eye: No papilledema.        Left eye: No papilledema.  Neck:     Thyroid: Thyromegaly present. No thyroid mass.     Vascular: No carotid bruit.     Trachea: Trachea normal.  Cardiovascular:     Rate and Rhythm: Normal rate and regular rhythm.     Pulses: Normal pulses.     Heart sounds: Normal heart sounds. No murmur heard. Pulmonary:     Effort: Pulmonary effort is normal. No respiratory distress.     Breath sounds: Normal breath sounds. No wheezing.  Chest:     Chest wall: No mass.  Breasts:    Tanner Score is 5.     Right: Normal. No mass or tenderness.     Left: Normal. No mass or  tenderness.  Abdominal:     General: Abdomen is flat. Bowel sounds are normal. There is no distension.     Palpations: Abdomen is soft.     Tenderness: There is no abdominal tenderness.  Genitourinary:    Rectum: Guaiac result negative.  Musculoskeletal:        General: No swelling or tenderness. Normal range of motion.     Cervical back: Full passive range of motion without pain, normal range of motion and neck supple.  Right lower leg: No edema.     Left lower leg: No edema.  Lymphadenopathy:     Upper Body:     Right upper body: No supraclavicular, axillary or pectoral adenopathy.     Left upper body: No supraclavicular, axillary or pectoral adenopathy.  Skin:    General: Skin is warm and dry.     Capillary Refill: Capillary refill takes less than 2 seconds.  Neurological:     General: No focal deficit present.     Mental Status: She is alert and oriented to person, place, and time.     Cranial Nerves: No cranial nerve deficit.     Sensory: No sensory deficit.  Psychiatric:        Mood and Affect: Mood normal.        Behavior: Behavior normal.        Thought Content: Thought content normal.        Judgment: Judgment normal.         Assessment And Plan:     Encounter for annual health examination Assessment & Plan: Behavior modifications discussed and diet history reviewed.   Pt will continue to exercise regularly and modify diet with low GI, plant based foods and decrease intake of processed foods.  Recommend intake of daily multivitamin, Vitamin D, and calcium.  Recommend mammogram and colonoscopy for preventive screenings, as well as recommend immunizations that include influenza, TDAP, and Shingles    Prediabetes Assessment & Plan: Continue metformin hemoglobin A1c was slightly elevated at last visit.  Discussed risk of developing diabetes  Orders: -     CMP14+EGFR -     Hemoglobin A1c  Hyperlipidemia, unspecified hyperlipidemia type Assessment &  Plan: Cholesterol levels have been stable.  Continue statin.  Orders: -     Lipid panel  Vitamin D deficiency Assessment & Plan: Will check vitamin D level and supplement as needed.    Also encouraged to spend 15 minutes in the sun daily.    Orders: -     VITAMIN D 25 Hydroxy (Vit-D Deficiency, Fractures)  Need for influenza vaccination Assessment & Plan: Influenza vaccine administered Encouraged to take Tylenol as needed for fever or muscle aches.   Orders: -     Flu vaccine trivalent PF, 6mos and older(Flulaval,Afluria,Fluarix,Fluzone)  COVID-19 vaccination declined Assessment & Plan: Declines covid 19 vaccine. Discussed risk of covid 44 and if she changes her mind about the vaccine to call the office. Education has been provided regarding the importance of this vaccine but patient still declined. Advised may receive this vaccine at local pharmacy or Health Dept.or vaccine clinic. Aware to provide a copy of the vaccination record if obtained from local pharmacy or Health Dept.  Encouraged to take multivitamin, vitamin d, vitamin c and zinc to increase immune system. Aware can call office if would like to have vaccine here at office. Verbalized acceptance and understanding.    Palpitation Assessment & Plan: Recent loss of her husband so she is under more stress.  Advised to limit her intake of caffeine and make sure to stay well-hydrated.  EKG is as follows EKG done NSR HR 65.   Orders: -     EKG 12-Lead -     TSH  Other long term (current) drug therapy -     CBC with Differential/Platelet  Grief Assessment & Plan: She buried her husband today.  Has been out of work but is planning to go back tomorrow I offered to take her out of work  for several weeks at this time she has declined.  She is aware to return call to office if she changes her mind.     No follow-ups on file. Patient was given opportunity to ask questions. Patient verbalized understanding of the plan and  was able to repeat key elements of the plan. All questions were answered to their satisfaction.   Lori Felts, FNP  I, Lori Felts, FNP, have reviewed all documentation for this visit. The documentation on 02/26/23 for the exam, diagnosis, procedures, and orders are all accurate and complete.

## 2023-02-26 NOTE — Assessment & Plan Note (Addendum)
Recent loss of her husband so she is under more stress.  Advised to limit her intake of caffeine and make sure to stay well-hydrated.  EKG is as follows EKG done NSR HR 65.

## 2023-02-27 LAB — CBC WITH DIFFERENTIAL/PLATELET
Basophils Absolute: 0 10*3/uL (ref 0.0–0.2)
Basos: 1 %
EOS (ABSOLUTE): 0.1 10*3/uL (ref 0.0–0.4)
Eos: 1 %
Hematocrit: 37.7 % (ref 34.0–46.6)
Hemoglobin: 12.3 g/dL (ref 11.1–15.9)
Immature Grans (Abs): 0 10*3/uL (ref 0.0–0.1)
Immature Granulocytes: 0 %
Lymphocytes Absolute: 2 10*3/uL (ref 0.7–3.1)
Lymphs: 34 %
MCH: 28.9 pg (ref 26.6–33.0)
MCHC: 32.6 g/dL (ref 31.5–35.7)
MCV: 89 fL (ref 79–97)
Monocytes Absolute: 0.5 10*3/uL (ref 0.1–0.9)
Monocytes: 8 %
Neutrophils Absolute: 3.2 10*3/uL (ref 1.4–7.0)
Neutrophils: 56 %
Platelets: 223 10*3/uL (ref 150–450)
RBC: 4.25 x10E6/uL (ref 3.77–5.28)
RDW: 14.1 % (ref 11.7–15.4)
WBC: 5.7 10*3/uL (ref 3.4–10.8)

## 2023-02-27 LAB — LIPID PANEL
Chol/HDL Ratio: 2.3 ratio (ref 0.0–4.4)
Cholesterol, Total: 173 mg/dL (ref 100–199)
HDL: 75 mg/dL (ref >39)
LDL Chol Calc (NIH): 89 mg/dL (ref 0–99)
Triglycerides: 40 mg/dL (ref 0–149)
VLDL Cholesterol Cal: 9 mg/dL (ref 5–40)

## 2023-02-27 LAB — HEMOGLOBIN A1C
Est. average glucose Bld gHb Est-mCnc: 137 mg/dL
Hgb A1c MFr Bld: 6.4 % — ABNORMAL HIGH (ref 4.8–5.6)

## 2023-02-27 LAB — CMP14+EGFR
ALT: 15 [IU]/L (ref 0–32)
AST: 14 [IU]/L (ref 0–40)
Albumin: 4 g/dL (ref 3.9–4.9)
Alkaline Phosphatase: 114 [IU]/L (ref 44–121)
BUN/Creatinine Ratio: 19 (ref 12–28)
BUN: 16 mg/dL (ref 8–27)
Bilirubin Total: <0.2 mg/dL (ref 0.0–1.2)
CO2: 26 mmol/L (ref 20–29)
Calcium: 9.2 mg/dL (ref 8.7–10.3)
Chloride: 106 mmol/L (ref 96–106)
Creatinine, Ser: 0.85 mg/dL (ref 0.57–1.00)
Globulin, Total: 2.4 g/dL (ref 1.5–4.5)
Glucose: 94 mg/dL (ref 70–99)
Potassium: 4.1 mmol/L (ref 3.5–5.2)
Sodium: 142 mmol/L (ref 134–144)
Total Protein: 6.4 g/dL (ref 6.0–8.5)
eGFR: 76 mL/min/{1.73_m2} (ref >59)

## 2023-02-27 LAB — TSH: TSH: 1.51 u[IU]/mL (ref 0.450–4.500)

## 2023-02-27 LAB — VITAMIN D 25 HYDROXY (VIT D DEFICIENCY, FRACTURES): Vit D, 25-Hydroxy: 65.9 ng/mL (ref 30.0–100.0)

## 2023-03-07 ENCOUNTER — Encounter: Payer: Self-pay | Admitting: Nurse Practitioner

## 2023-03-07 ENCOUNTER — Ambulatory Visit (INDEPENDENT_AMBULATORY_CARE_PROVIDER_SITE_OTHER): Payer: PRIVATE HEALTH INSURANCE | Admitting: Nurse Practitioner

## 2023-03-07 VITALS — BP 150/72 | HR 62 | Temp 98.0°F | Ht 64.0 in | Wt 174.4 lb

## 2023-03-07 DIAGNOSIS — I10 Essential (primary) hypertension: Secondary | ICD-10-CM | POA: Insufficient documentation

## 2023-03-07 DIAGNOSIS — R030 Elevated blood-pressure reading, without diagnosis of hypertension: Secondary | ICD-10-CM

## 2023-03-07 DIAGNOSIS — G4452 New daily persistent headache (NDPH): Secondary | ICD-10-CM | POA: Diagnosis not present

## 2023-03-07 DIAGNOSIS — F4321 Adjustment disorder with depressed mood: Secondary | ICD-10-CM

## 2023-03-07 HISTORY — DX: New daily persistent headache (ndph): G44.52

## 2023-03-07 MED ORDER — AMLODIPINE BESYLATE 2.5 MG PO TABS: 2.5000 mg | ORAL_TABLET | Freq: Every day | ORAL | 2 refills | Status: DC

## 2023-03-07 NOTE — Progress Notes (Signed)
[name], CMA,acting as a Neurosurgeon for [name], FNP.,have documented all relevant documentation on the behalf of [name], FNP,as directed by  [name], FNP while in the presence of [name], FNP.  Subjective:  Patient ID: Lori Mcdaniel , female    DOB: 27-Mar-1956 , 67 y.o.   MRN: [id number]  No chief complaint on file.   HPI  Patient presents today for her blood pressure being elevated at her job. Patient reports her head has been having a "weird" feeling like a pressure. Patient reports it feels kinda like pressure since Sunday.  She reports her Blood pressure at work was 174/59. Patient reports she may not be getting a good amount of  rest. She still feels tired since recently. She has not gotten magnesium that I recommended at her last visit. She took a pain away and she had a baby aspirin.     Past Medical History:  Diagnosis Date   GERD (gastroesophageal reflux disease)    years ago   Thyroid mass of unclear etiology      Family History  Problem Relation Age of Onset   Diabetes Mother    Alzheimer's disease Mother    Alzheimer's disease Father    Stroke Father    Breast cancer Neg Hx      Current Outpatient Medications:    amLODipine (NORVASC) 2.5 MG tablet, Take 1 tablet (2.5 mg total) by mouth daily., Disp: 30 tablet, Rfl: 2   atorvastatin (LIPITOR) 10 MG tablet, TAKE 1 TABLET(10 MG) BY MOUTH DAILY, Disp: 90 tablet, Rfl: 1   meloxicam (MOBIC) 15 MG tablet, TAKE 1 TABLET(15 MG) BY MOUTH DAILY, Disp: 30 tablet, Rfl: 0   metFORMIN (GLUCOPHAGE) 500 MG tablet, Take 1 tablet (500 mg total) by mouth 2 (two) times daily with a meal., Disp: 180 tablet, Rfl: 1   Allergies  Allergen Reactions   Penicillins Nausea And Vomiting    UNSPECIFIED REACTION  "Pt has had med in last 10 yrs - was ok to take" Has patient had a PCN reaction causing immediate rash, facial/tongue/throat swelling, SOB or lightheadedness with hypotension: no Has patient had a PCN  reaction causing severe rash involving mucus membranes or skin necrosis: no Has patient had a PCN reaction that required hospitalization: no Has patient had a PCN reaction occurring within the last 10 years: no If all of the above answers are "NO", then may proceed with Cephalos     Review of Systems  Constitutional: Negative.   HENT: Negative.    Eyes:  Positive for visual disturbance.  Respiratory: Negative.    Cardiovascular: Negative.   Gastrointestinal: Negative.   Neurological:  Positive for headaches (frontal and temporal).  Psychiatric/Behavioral: Negative.       Today's Vitals   03/07/23 1032 03/07/23 1101  BP: (!) 152/70 (!) 150/72  Pulse: 62   Temp: 98 F (36.7 C)   TempSrc: Oral   Weight: 174 lb 6.4 oz (79.1 kg)   Height: 5\' 4"  (1.626 m)   PainSc: 0-No pain    Body mass index is 29.94 kg/m.  Wt Readings from Last 3 Encounters:  03/07/23 174 lb 6.4 oz (79.1 kg)  02/26/23 171 lb 12.8 oz (77.9 kg)  08/22/22 172 lb 12.8 oz (78.4 kg)    The 10-year ASCVD risk score (Arnett DK, et al., 2019) is: 8.6%   Values used to calculate the score:     Age: 4 years     Sex: Female  Is Non-Hispanic African American: Yes     Diabetic: No     Tobacco smoker: No     Systolic Blood Pressure: 150 mmHg     Is BP treated: No     HDL Cholesterol: 75 mg/dL     Total Cholesterol: 173 mg/dL  Objective:  Physical Exam Vitals reviewed.  Constitutional:      General: She is not in acute distress.    Appearance: Normal appearance. She is obese.  Cardiovascular:     Rate and Rhythm: Normal rate and regular rhythm.     Pulses: Normal pulses.     Heart sounds: Normal heart sounds. No murmur heard. Pulmonary:     Effort: Pulmonary effort is normal. No respiratory distress.     Breath sounds: Normal breath sounds. No wheezing.  Skin:    General: Skin is warm and dry.     Findings: No bruising.  Neurological:     General: No focal deficit present.     Mental Status: She is  alert and oriented to person, place, and time.     Cranial Nerves: No cranial nerve deficit.     Motor: No weakness.  Psychiatric:        Mood and Affect: Mood normal.        Behavior: Behavior normal.        Thought Content: Thought content normal.        Judgment: Judgment normal.         Assessment And Plan:  Elevated blood pressure reading Assessment & Plan: Blood pressure is elevated while in office. will start her on low-dose amlodipine as she deals with her current stress with a goal to discontinue once this is better controlled.  Follow-up in 2 weeks for nurse visit.  In follow-up with provider in 8 weeks for med check and blood pressure check.  Orders: -     CT HEAD WO CONTRAST ( ); Future -     amLODIPine Besylate; Take 1 tablet (2.5 mg total) by mouth daily.  Dispense: 30 tablet; Refill: 2  New daily persistent headache Assessment & Plan: She is now having a headache that is new due to being over the age of 89 will check a CT scan of the head.  This may be related to her blood pressure versus grief versus stress  Orders: -     CT HEAD WO CONTRAST ( ); Future  Grief Assessment & Plan: She is grieving the loss of her husband.  This may be affecting her blood pressure.  I have encouraged her to seek grief counseling she can do this through her job.     Return for 2 week NV BP- 8 week with JM.  Patient was given opportunity to ask questions. Patient verbalized understanding of the plan and was able to repeat key elements of the plan. All questions were answered to their satisfaction.    Jeanell Sparrow, FNP, have reviewed all documentation for this visit. The documentation on 03/07/23 for the exam, diagnosis, procedures, and orders are all accurate and complete.   IF YOU HAVE BEEN REFERRED TO A SPECIALIST, IT MAY TAKE 1-2 WEEKS TO SCHEDULE/PROCESS THE REFERRAL. IF YOU HAVE NOT HEARD FROM US/SPECIALIST IN TWO WEEKS, PLEASE GIVE [place] A CALL AT [phone].

## 2023-03-08 NOTE — Assessment & Plan Note (Signed)

## 2023-03-08 NOTE — Assessment & Plan Note (Signed)
Influenza vaccine administered Encouraged to take Tylenol as needed for fever or muscle aches.

## 2023-03-08 NOTE — Assessment & Plan Note (Signed)

## 2023-03-08 NOTE — Assessment & Plan Note (Signed)
Cholesterol levels have been stable. Continue statin .

## 2023-03-08 NOTE — Assessment & Plan Note (Signed)
Will check vitamin D level and supplement as needed.    Also encouraged to spend 15 minutes in the sun daily.   

## 2023-03-08 NOTE — Assessment & Plan Note (Signed)
She buried her husband today.  Has been out of work but is planning to go back tomorrow I offered to take her out of work for several weeks at this time she has declined.  She is aware to return call to office if she changes her mind.

## 2023-03-08 NOTE — Assessment & Plan Note (Signed)
Continue metformin hemoglobin A1c was slightly elevated at last visit.  Discussed risk of developing diabetes

## 2023-03-09 ENCOUNTER — Encounter: Payer: Self-pay | Admitting: Nurse Practitioner

## 2023-03-12 ENCOUNTER — Ambulatory Visit
Admission: RE | Admit: 2023-03-12 | Discharge: 2023-03-12 | Disposition: A | Discharge status: Home / Self Care | Payer: PRIVATE HEALTH INSURANCE | Source: Ambulatory Visit | Attending: Nurse Practitioner | Admitting: Nurse Practitioner

## 2023-03-12 DIAGNOSIS — G4452 New daily persistent headache (NDPH): Secondary | ICD-10-CM

## 2023-03-12 DIAGNOSIS — R030 Elevated blood-pressure reading, without diagnosis of hypertension: Secondary | ICD-10-CM

## 2023-03-17 NOTE — Assessment & Plan Note (Addendum)
She is now having a headache that is new due to being over the age of 27 will check a CT scan of the head.  This may be related to her blood pressure versus grief versus stress

## 2023-03-17 NOTE — Assessment & Plan Note (Addendum)
Blood pressure is elevated while in office. will start her on low-dose amlodipine as she deals with her current stress with a goal to discontinue once this is better controlled.  Follow-up in 2 weeks for nurse visit.  In follow-up with provider in 8 weeks for med check and blood pressure check.

## 2023-03-17 NOTE — Assessment & Plan Note (Signed)
She is grieving the loss of her husband.  This may be affecting her blood pressure.  I have encouraged her to seek grief counseling she can do this through her job.

## 2023-03-20 ENCOUNTER — Ambulatory Visit: Payer: PRIVATE HEALTH INSURANCE

## 2023-03-20 VITALS — BP 122/68 | HR 86 | Temp 98.1°F | Ht 64.0 in | Wt 174.0 lb

## 2023-03-20 DIAGNOSIS — R030 Elevated blood-pressure reading, without diagnosis of hypertension: Secondary | ICD-10-CM

## 2023-03-20 NOTE — Progress Notes (Unsigned)
Patient presents today for bpc. She currently takes Amlodipine 2.5MG . she reports taking medication in the morning. Denies headache, chest pain & sob.  BP Readings from Last 3 Encounters:  03/20/23 122/68  03/07/23 (!) 150/72  02/26/23 130/70  Per provider patient is to continue with current regimen. She will follow up at January visit. Patient aware.

## 2023-05-07 ENCOUNTER — Ambulatory Visit: Payer: Self-pay | Admitting: Nurse Practitioner

## 2023-05-07 ENCOUNTER — Other Ambulatory Visit: Payer: Self-pay | Admitting: Nurse Practitioner

## 2023-05-08 ENCOUNTER — Other Ambulatory Visit: Payer: Self-pay | Admitting: Nurse Practitioner

## 2023-05-15 ENCOUNTER — Ambulatory Visit: Payer: PRIVATE HEALTH INSURANCE | Admitting: Nurse Practitioner

## 2023-05-15 ENCOUNTER — Encounter: Payer: Self-pay | Admitting: Nurse Practitioner

## 2023-05-15 VITALS — BP 120/64 | HR 72 | Temp 99.0°F | Ht 64.0 in | Wt 172.2 lb

## 2023-05-15 DIAGNOSIS — E2839 Other primary ovarian failure: Secondary | ICD-10-CM

## 2023-05-15 DIAGNOSIS — R7303 Prediabetes: Secondary | ICD-10-CM

## 2023-05-15 DIAGNOSIS — R030 Elevated blood-pressure reading, without diagnosis of hypertension: Secondary | ICD-10-CM | POA: Diagnosis not present

## 2023-05-15 DIAGNOSIS — M79674 Pain in right toe(s): Secondary | ICD-10-CM | POA: Insufficient documentation

## 2023-05-15 DIAGNOSIS — Z23 Encounter for immunization: Secondary | ICD-10-CM

## 2023-05-15 DIAGNOSIS — Z2821 Immunization not carried out because of patient refusal: Secondary | ICD-10-CM

## 2023-05-15 DIAGNOSIS — Z6829 Body mass index (BMI) 29.0-29.9, adult: Secondary | ICD-10-CM

## 2023-05-15 DIAGNOSIS — E785 Hyperlipidemia, unspecified: Secondary | ICD-10-CM | POA: Diagnosis not present

## 2023-05-15 MED ORDER — ATORVASTATIN CALCIUM 10 MG PO TABS: 10.0000 mg | ORAL_TABLET | Freq: Every day | ORAL | 1 refills | Status: DC

## 2023-05-15 MED ORDER — AMLODIPINE BESYLATE 2.5 MG PO TABS: 2.5000 mg | ORAL_TABLET | Freq: Every day | ORAL | 2 refills | Status: DC

## 2023-05-15 MED ORDER — METFORMIN HCL 500 MG PO TABS: 500.0000 mg | ORAL_TABLET | Freq: Two times a day (BID) | ORAL | 1 refills | Status: DC

## 2023-05-15 NOTE — Patient Instructions (Addendum)
Buddy tape your 4th toe to the 3rd toe.  Continue focusing on low salt diet and regular physical activity to keep your BP down if your blood pressure remains low we can trial you off at your next visit.

## 2023-05-15 NOTE — Progress Notes (Unsigned)
Lori Mcdaniel, CMA,acting as a Neurosurgeon for Lori Felts, FNP.,have documented all relevant documentation on the behalf of Lori Felts, FNP,as directed by  Lori Felts, FNP while in the presence of Lori Felts, FNP.  Subjective:  Patient ID: Lori Mcdaniel , female    DOB: 09/09/1955 , 68 y.o.   MRN: [id number]  No chief complaint on file.   HPI  Patient presents today for a chol follow up, Patient reports compliance with medication. Patient denies any chest pain, SOB, or headaches. Patient report she hit her right foot she hit her 4th toe. While she was getting ready for work.  She reports at first it hurt to walk now it is sore to the touch.  The pain is remaining. She took some meloxicam once with mild relief.      Past Medical History:  Diagnosis Date   GERD (gastroesophageal reflux disease)    years ago   Thyroid mass of unclear etiology      Family History  Problem Relation Age of Onset   Diabetes Mother    Alzheimer's disease Mother    Alzheimer's disease Father    Stroke Father    Breast cancer Neg Hx      Current Outpatient Medications:    meloxicam (MOBIC) 15 MG tablet, TAKE 1 TABLET(15 MG) BY MOUTH DAILY, Disp: 30 tablet, Rfl: 0   amLODipine (NORVASC) 2.5 MG tablet, Take 1 tablet (2.5 mg total) by mouth daily., Disp: 90 tablet, Rfl: 2   atorvastatin (LIPITOR) 10 MG tablet, Take 1 tablet (10 mg total) by mouth daily., Disp: 90 tablet, Rfl: 1   metFORMIN (GLUCOPHAGE) 500 MG tablet, Take 1 tablet (500 mg total) by mouth 2 (two) times daily with a meal., Disp: 180 tablet, Rfl: 1   Allergies  Allergen Reactions   Penicillins Nausea And Vomiting    UNSPECIFIED REACTION  "Pt has had med in last 10 yrs - was ok to take" Has patient had a PCN reaction causing immediate rash, facial/tongue/throat swelling, SOB or lightheadedness with hypotension: no Has patient had a PCN reaction causing severe rash involving mucus membranes or skin necrosis: no Has patient had a PCN  reaction that required hospitalization: no Has patient had a PCN reaction occurring within the last 10 years: no If all of the above answers are "NO", then may proceed with Cephalos     Review of Systems  Constitutional: Negative.   HENT: Negative.    Eyes:  Negative for visual disturbance.  Respiratory: Negative.    Cardiovascular: Negative.   Gastrointestinal: Negative.   Neurological:  Positive for headaches (frontal and temporal).  Psychiatric/Behavioral: Negative.       Today's Vitals   05/15/23 1550  BP: 120/64  Pulse: 72  Temp: 99 F (37.2 C)  TempSrc: Oral  Weight: 172 lb 3.2 oz (78.1 kg)  Height: 5\' 4"  (1.626 m)  PainSc: 5   PainLoc: Toe   Body mass index is 29.56 kg/m.  Wt Readings from Last 3 Encounters:  05/15/23 172 lb 3.2 oz (78.1 kg)  03/20/23 174 lb (78.9 kg)  03/07/23 174 lb 6.4 oz (79.1 kg)    The 10-year ASCVD risk score (Arnett DK, et al., 2019) is: 5%   Values used to calculate the score:     Age: 17 years     Sex: Female     Is Non-Hispanic African American: Yes     Diabetic: No     Tobacco smoker: No  Systolic Blood Pressure: 120 mmHg     Is BP treated: No     HDL Cholesterol: 68 mg/dL     Total Cholesterol: 150 mg/dL  Objective:  Physical Exam Vitals reviewed.  Constitutional:      General: She is not in acute distress.    Appearance: Normal appearance. She is obese.  Cardiovascular:     Rate and Rhythm: Normal rate and regular rhythm.     Pulses: Normal pulses.     Heart sounds: Normal heart sounds. No murmur heard. Pulmonary:     Effort: Pulmonary effort is normal. No respiratory distress.     Breath sounds: Normal breath sounds. No wheezing.  Skin:    General: Skin is warm and dry.     Findings: No bruising.  Neurological:     General: No focal deficit present.     Mental Status: She is alert and oriented to person, place, and time.     Cranial Nerves: No cranial nerve deficit.     Motor: No weakness.  Psychiatric:         Mood and Affect: Mood normal.        Behavior: Behavior normal.        Thought Content: Thought content normal.        Judgment: Judgment normal.         Assessment And Plan:  Elevated blood pressure reading Assessment & Plan: Blood pressure is better controlled on amlodipine. I would like to wait another few months before considering weaning off blood pressure medications. She is to continue focusing on healthy diet and regular exercise. Low salt diet.   Orders: -     CMP14+EGFR -     amLODIPine Besylate; Take 1 tablet (2.5 mg total) by mouth daily.  Dispense: 90 tablet; Refill: 2  Prediabetes Assessment & Plan: Continue metformin hemoglobin A1c was slightly elevated at last visit.  Encouraged to focus on healthy diet low in sugar and starches.   Orders: -     Hemoglobin A1c -     CMP14+EGFR -     metFORMIN HCl; Take 1 tablet (500 mg total) by mouth 2 (two) times daily with a meal.  Dispense: 180 tablet; Refill: 1  Hyperlipidemia, unspecified hyperlipidemia type Assessment & Plan: Cholesterol levels have been stable.  Continue statin.  Orders: -     Lipid panel  Pain of toe of right foot Assessment & Plan: Mild tenderness to touch to 4th toe, no deformity. She is able to walk on her foot. Will hold off on xray due to improving.    COVID-19 vaccination declined Assessment & Plan: Declines covid 19 vaccine. Discussed risk of covid 89 and if she changes her mind about the vaccine to call the office. Education has been provided regarding the importance of this vaccine but patient still declined. Advised may receive this vaccine at local pharmacy or Health Dept.or vaccine clinic. Aware to provide a copy of the vaccination record if obtained from local pharmacy or Health Dept.  Encouraged to take multivitamin, vitamin d, vitamin c and zinc to increase immune system. Aware can call office if would like to have vaccine here at office. Verbalized acceptance and  understanding.    Need for pneumococcal vaccination Assessment & Plan: Prevnar 20 given in office.   Orders: -     Pneumococcal conjugate vaccine 20-valent  BMI 29.0-29.9,adult  Decreased estrogen level -     DG Bone Density; Future  Other orders -  Atorvastatin Calcium; Take 1 tablet (10 mg total) by mouth daily.  Dispense: 90 tablet; Refill: 1    Return for 6 month bp check.  Patient was given opportunity to ask questions. Patient verbalized understanding of the plan and was able to repeat key elements of the plan. All questions were answered to their satisfaction.    Jeanell Sparrow, FNP, have reviewed all documentation for this visit. The documentation on 05/15/23 for the exam, diagnosis, procedures, and orders are all accurate and complete.   IF YOU HAVE BEEN REFERRED TO A SPECIALIST, IT MAY TAKE 1-2 WEEKS TO SCHEDULE/PROCESS THE REFERRAL. IF YOU HAVE NOT HEARD FROM US/SPECIALIST IN TWO WEEKS, PLEASE GIVE [place] A CALL AT [phone].

## 2023-05-16 LAB — CMP14+EGFR
ALT: 14 [IU]/L (ref 0–32)
AST: 22 [IU]/L (ref 0–40)
Albumin: 4.2 g/dL (ref 3.9–4.9)
Alkaline Phosphatase: 102 [IU]/L (ref 44–121)
BUN/Creatinine Ratio: 14 (ref 12–28)
BUN: 11 mg/dL (ref 8–27)
Bilirubin Total: 0.3 mg/dL (ref 0.0–1.2)
CO2: 25 mmol/L (ref 20–29)
Calcium: 9.6 mg/dL (ref 8.7–10.3)
Chloride: 105 mmol/L (ref 96–106)
Creatinine, Ser: 0.8 mg/dL (ref 0.57–1.00)
Globulin, Total: 2.3 g/dL (ref 1.5–4.5)
Glucose: 72 mg/dL (ref 70–99)
Potassium: 4.1 mmol/L (ref 3.5–5.2)
Sodium: 144 mmol/L (ref 134–144)
Total Protein: 6.5 g/dL (ref 6.0–8.5)
eGFR: 82 mL/min/{1.73_m2} (ref >59)

## 2023-05-16 LAB — HEMOGLOBIN A1C
Est. average glucose Bld gHb Est-mCnc: 120 mg/dL
Hgb A1c MFr Bld: 5.8 % — ABNORMAL HIGH (ref 4.8–5.6)

## 2023-05-16 LAB — LIPID PANEL
Chol/HDL Ratio: 2.2 ratio (ref 0.0–4.4)
Cholesterol, Total: 150 mg/dL (ref 100–199)
HDL: 68 mg/dL
LDL Chol Calc (NIH): 73 mg/dL (ref 0–99)
Triglycerides: 38 mg/dL (ref 0–149)
VLDL Cholesterol Cal: 9 mg/dL (ref 5–40)

## 2023-05-23 ENCOUNTER — Encounter: Payer: Self-pay | Admitting: Nurse Practitioner

## 2023-05-23 NOTE — Assessment & Plan Note (Signed)
Blood pressure is better controlled on amlodipine. I would like to wait another few months before considering weaning off blood pressure medications. She is to continue focusing on healthy diet and regular exercise. Low salt diet.

## 2023-05-23 NOTE — Assessment & Plan Note (Signed)

## 2023-05-23 NOTE — Assessment & Plan Note (Signed)
Prevnar 20 given in office

## 2023-05-23 NOTE — Assessment & Plan Note (Signed)
Cholesterol levels have been stable.  Continue statin.

## 2023-05-23 NOTE — Assessment & Plan Note (Signed)
Mild tenderness to touch to 4th toe, no deformity. She is able to walk on her foot. Will hold off on xray due to improving.

## 2023-05-23 NOTE — Assessment & Plan Note (Signed)
Continue metformin hemoglobin A1c was slightly elevated at last visit.  Encouraged to focus on healthy diet low in sugar and starches.

## 2023-05-24 DIAGNOSIS — Z23 Encounter for immunization: Secondary | ICD-10-CM | POA: Diagnosis not present

## 2023-06-03 ENCOUNTER — Other Ambulatory Visit: Payer: Self-pay | Admitting: Nurse Practitioner

## 2023-06-03 DIAGNOSIS — R030 Elevated blood-pressure reading, without diagnosis of hypertension: Secondary | ICD-10-CM

## 2023-07-16 ENCOUNTER — Encounter: Payer: Self-pay | Admitting: Nurse Practitioner

## 2023-07-16 DIAGNOSIS — Z0000 Encounter for general adult medical examination without abnormal findings: Secondary | ICD-10-CM

## 2023-09-05 ENCOUNTER — Ambulatory Visit: Payer: PRIVATE HEALTH INSURANCE

## 2023-09-05 ENCOUNTER — Other Ambulatory Visit: Payer: Self-pay

## 2023-09-05 ENCOUNTER — Ambulatory Visit
Admission: RE | Admit: 2023-09-05 | Discharge: 2023-09-05 | Disposition: A | Discharge status: Home / Self Care | Payer: PRIVATE HEALTH INSURANCE | Source: Ambulatory Visit | Attending: Family Medicine | Admitting: Family Medicine

## 2023-09-05 VITALS — BP 157/69 | HR 71 | Temp 99.1°F | Resp 18

## 2023-09-05 DIAGNOSIS — R0602 Shortness of breath: Secondary | ICD-10-CM

## 2023-09-05 DIAGNOSIS — R079 Chest pain, unspecified: Secondary | ICD-10-CM | POA: Diagnosis not present

## 2023-09-05 MED ORDER — CYCLOBENZAPRINE HCL 5 MG PO TABS: 5.0000 mg | ORAL_TABLET | Freq: Every evening | ORAL | 0 refills | Status: DC | PRN

## 2023-09-05 MED ORDER — MELOXICAM 7.5 MG PO TABS: 7.5000 mg | ORAL_TABLET | Freq: Every day | ORAL | 0 refills | Status: DC

## 2023-09-05 NOTE — ED Provider Notes (Signed)
 [facility]  Note:  This document was prepared using Conservation officer, historic buildings and may include unintentional dictation errors.  MRN: [id number] DOB: 05/16/1955  Subjective:   Lori Mcdaniel is a 68 y.o. female presenting for 3-day history of persistent left-sided internal chest/thoracic pain.  Reports that it is causing some shortness of breath especially with significant pain on deep inspiration.  No fever, cough, wheezing, upper respiratory symptoms, nausea, vomiting, abdominal pain, diaphoresis, rashes.  No falls, trauma.  No strenuous physical activity or exercise.  No smoking of any kind including cigarettes, cigars, vaping, marijuana use.  No asthma.  No history of pulmonary embolism, MI, heart disease.  No smoking of any kind including cigarettes, cigars, vaping, marijuana use.  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  No current facility-administered medications for this encounter.  Current Outpatient Medications:    amLODipine  (NORVASC ) 2.5 MG tablet, TAKE 1 TABLET(2.5 MG) BY MOUTH DAILY, Disp: 30 tablet, Rfl: 1   atorvastatin  (LIPITOR) 10 MG tablet, Take 1 tablet (10 mg total) by mouth daily., Disp: 90 tablet, Rfl: 1   meloxicam  (MOBIC ) 15 MG tablet, TAKE 1 TABLET(15 MG) BY MOUTH DAILY, Disp: 30 tablet, Rfl: 0   metFORMIN  (GLUCOPHAGE ) 500 MG tablet, Take 1 tablet (500 mg total) by mouth 2 (two) times daily with a meal., Disp: 180 tablet, Rfl: 1   Allergies  Allergen Reactions   Penicillins Nausea And Vomiting    UNSPECIFIED REACTION  "Pt has had med in last 10 yrs - was ok to take" Has patient had a PCN reaction causing immediate rash, facial/tongue/throat swelling, SOB or lightheadedness with hypotension: no Has patient had a PCN reaction causing severe rash involving mucus membranes or skin necrosis: no Has patient had a PCN reaction that required hospitalization: no Has patient had a PCN reaction  occurring within the last 10 years: no If all of the above answers are "NO", then may proceed with Cephalos    Past Medical History:  Diagnosis Date   GERD (gastroesophageal reflux disease)    years ago   Thyroid  mass of unclear etiology      Past Surgical History:  Procedure Laterality Date   ABDOMINAL HYSTERECTOMY     BREAST CYST EXCISION Left 1973   CESAREAN SECTION     COLONOSCOPY     KELOID EXCISION Bilateral    "ears"   THYROIDECTOMY Right 06/27/2017   THYROIDECTOMY Right 06/27/2017   Procedure: THYROIDECTOMY;  Surgeon: Janita Mellow, MD;  Location: [facility] OR;  Service: ENT;  Laterality: Right;  Right Thyroid  lobectomy, frozen section possible total    Family History  Problem Relation Age of Onset   Diabetes Mother    Alzheimer's disease Mother    Alzheimer's disease Father    Stroke Father    Breast cancer Neg Hx     Social History   Tobacco Use   Smoking status: Never   Smokeless tobacco: Never  Vaping Use   Vaping status: Never Used  Substance Use Topics   Alcohol use: No   Drug use: No    ROS   Objective:   Vitals: BP (!) 157/69 (BP Location: Left Arm)   Pulse 71   Temp 99.1 F (37.3 C) (Oral)   Resp 18   SpO2 98%   Physical Exam Constitutional:      General: She is not in acute distress.    Appearance: Normal appearance. She is well-developed. She is  not ill-appearing, toxic-appearing or diaphoretic.  HENT:     Head: Normocephalic and atraumatic.     Nose: Nose normal.     Mouth/Throat:     Mouth: Mucous membranes are moist.  Eyes:     General: No scleral icterus.       Right eye: No discharge.        Left eye: No discharge.     Extraocular Movements: Extraocular movements intact.  Cardiovascular:     Rate and Rhythm: Normal rate and regular rhythm.     Heart sounds: Normal heart sounds. No murmur heard.    No friction rub. No gallop.  Pulmonary:     Effort: Pulmonary effort is normal. No respiratory distress.     Breath sounds: No  stridor. No decreased breath sounds, wheezing, rhonchi or rales.  Chest:     Chest wall: No tenderness.  Skin:    General: Skin is warm and dry.  Neurological:     General: No focal deficit present.     Mental Status: She is alert and oriented to person, place, and time.  Psychiatric:        Mood and Affect: Mood normal.        Behavior: Behavior normal.    ED ECG REPORT   Date: 09/05/2023  EKG Time: 4:12 PM  Rate: 55 bpm  Rhythm: sinus bradycardia,  unchanged from previous tracings  Axis: normal  Intervals:none  ST&T Change: none   Narrative Interpretation: Sinus bradycardia without any acute findings. No changes.   DG Chest 2 View Result Date: 09/05/2023 CLINICAL DATA:  Shortness of breath. EXAM: CHEST - 2 VIEW COMPARISON:  Chest two views 06/27/2017 FINDINGS: Cardiac silhouette and mediastinal contours are within normal limits. Mild atherosclerotic calcifications within the aortic arch. The lungs are clear. No pleural effusion or pneumothorax. Mild-to-moderate multilevel degenerative disc changes of the thoracic spine. IMPRESSION: No active cardiopulmonary disease. Electronically Signed   By: Bertina Broccoli M.D.   On: 09/05/2023 16:45    Assessment and Plan :   PDMP not reviewed this encounter.  1. Left-sided chest pain   2. Shortness of breath     Suspect a thoracic strain and recommended conservative management with an NSAID, muscle relaxant.  Counseled patient on potential for adverse effects with medications prescribed/recommended today, ER and return-to-clinic precautions discussed, patient verbalized understanding.    Adolph Hoop, [place] 09/05/23 1649

## 2023-09-05 NOTE — ED Triage Notes (Addendum)
 Pt c/o SOB exertion with pain to lt rib with taking a deep breath since Monday. States had a 4 hr car ride 2wks ago. NAD. Pt speaking in complete sentences.

## 2023-11-12 ENCOUNTER — Ambulatory Visit: Payer: PRIVATE HEALTH INSURANCE | Admitting: Nurse Practitioner

## 2023-11-12 ENCOUNTER — Encounter: Payer: Self-pay | Admitting: Nurse Practitioner

## 2023-11-12 VITALS — BP 140/64 | HR 90 | Temp 98.7°F | Ht 64.0 in | Wt 174.0 lb

## 2023-11-12 DIAGNOSIS — E785 Hyperlipidemia, unspecified: Secondary | ICD-10-CM | POA: Diagnosis not present

## 2023-11-12 DIAGNOSIS — R7303 Prediabetes: Secondary | ICD-10-CM | POA: Diagnosis not present

## 2023-11-12 DIAGNOSIS — I10 Essential (primary) hypertension: Secondary | ICD-10-CM

## 2023-11-12 DIAGNOSIS — M778 Other enthesopathies, not elsewhere classified: Secondary | ICD-10-CM

## 2023-11-12 DIAGNOSIS — M25842 Other specified joint disorders, left hand: Secondary | ICD-10-CM | POA: Diagnosis not present

## 2023-11-12 MED ORDER — MELOXICAM 7.5 MG PO TABS: 7.5000 mg | ORAL_TABLET | Freq: Every day | ORAL | 1 refills | Status: DC

## 2023-11-12 NOTE — Assessment & Plan Note (Signed)
 Stable on current medication. - Check cholesterol levels.

## 2023-11-12 NOTE — Assessment & Plan Note (Signed)
 Right shoulder pain managed with meloxicam . - Prescribe meloxicam  as needed for pain management.

## 2023-11-12 NOTE — Assessment & Plan Note (Addendum)
 In-office blood pressure elevated, possibly due to improper positioning. - Recheck blood pressure during visit which was the same.  - Encouraged regular home blood pressure monitoring. - she is to check her blood pressure daily and send us  the readings in a week

## 2023-11-12 NOTE — Assessment & Plan Note (Signed)
 Stable on current medication. - Check hemoglobin A1c and kidney function.

## 2023-11-12 NOTE — Assessment & Plan Note (Signed)
 Small, non-painful cyst on left hand, likely benign. - Order ultrasound of left hand after insurance switch to confirm diagnosis

## 2023-11-12 NOTE — Progress Notes (Signed)
 [name], CMA,acting as a Neurosurgeon for Gaines Ada, FNP.,have documented all relevant documentation on the behalf of Gaines Ada, FNP,as directed by  Gaines Ada, FNP while in the presence of Gaines Ada, FNP.  Subjective:  Patient ID: Lori Mcdaniel , female    DOB: 1956-04-20 , 68 y.o.   MRN: [id number]  Chief Complaint  Patient presents with   Hyperlipidemia    Patient presents today for a chol follow up, Patient reports compliance with medication. Patient denies any chest pain, SOB, or headaches. Patient has no concerns today.     HPI  Discussed the use of AI scribe software for clinical note transcription with the patient, who gave verbal consent to proceed.  History of Present Illness Lori Mcdaniel is a 68 year old female with hypertension, hyperlipidemia, and prediabetes who presents for a follow-up visit.  She manages her medications through [facility] and receives refills as needed. She estimates her blood pressure at home is around 120/80 mmHg, although she does not check it consistently. Her sister also monitors her blood pressure, and she both have blood pressure cuffs.  Her busy lifestyle limits her ability to exercise regularly, but she participates in bowling once a week for two hours, which she considers her primary form of exercise. She is right-handed and experiences occasional right shoulder pain, especially when reaching without thinking. She has not seen an orthopedic specialist but was previously diagnosed with tendonitis at an urgent care visit, where she was prescribed meloxicam , which she takes as needed for pain relief.  She is in the process of switching her insurance to Medicare Part B due to issues with her current insurance not being accepted at her usual lab, [facility]. This has prevented her from completing certain tests, including a bone density scan and a mammogram. She is working with an agent to finalize the paperwork for Medicare Part B.  She has a  small, painless cyst-like knot on her left hand between the knuckles, which appeared suddenly. It does not cause any discomfort, but she is curious about its nature.  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 She is involved in various activities, including Bible study and organizing a bowling team.   Patient presents today for a f/u on her prediabetes,vitamin d  and chol. Patient reports compliance with her meds.     Hyperlipidemia This is a chronic problem. The current episode started more than 1 year ago. The problem is controlled. Recent lipid tests were reviewed and are normal. There are no known factors aggravating her hyperlipidemia. Pertinent negatives include no chest pain. Current antihyperlipidemic treatment includes diet change and statins. There are no compliance problems.      Past Medical History:  Diagnosis Date   GERD (gastroesophageal reflux disease)    years ago   New daily persistent headache 03/07/2023   Thyroid  mass of unclear etiology      Family History  Problem Relation Age of Onset   Diabetes Mother    Alzheimer's disease Mother    Alzheimer's disease Father    Stroke Father    Breast cancer Neg Hx      Current Outpatient Medications:    amLODipine  (NORVASC ) 2.5 MG tablet, TAKE 1 TABLET(2.5 MG) BY MOUTH DAILY, Disp: 30 tablet, Rfl: 1   atorvastatin  (LIPITOR) 10 MG tablet, Take 1 tablet (10 mg total) by mouth daily., Disp: 90 tablet, Rfl: 1   metFORMIN  (GLUCOPHAGE )  500 MG tablet, Take 1 tablet (500 mg total) by mouth 2 (two) times daily with a meal., Disp: 180 tablet, Rfl: 1   meloxicam  (MOBIC ) 7.5 MG tablet, Take 1 tablet (7.5 mg total) by mouth daily., Disp: 30 tablet, Rfl: 1   Allergies  Allergen Reactions   Penicillins Nausea And Vomiting    UNSPECIFIED REACTION  Pt has had med in last 10 yrs - was ok to take Has patient had a PCN reaction causing immediate rash,  facial/tongue/throat swelling, SOB or lightheadedness with hypotension: no Has patient had a PCN reaction causing severe rash involving mucus membranes or skin necrosis: no Has patient had a PCN reaction that required hospitalization: no Has patient had a PCN reaction occurring within the last 10 years: no If all of the above answers are NO, then may proceed with Cephalos     Review of Systems  Constitutional: Negative.   Respiratory: Negative.    Cardiovascular:  Negative for chest pain, palpitations and leg swelling.  Neurological: Negative.   Psychiatric/Behavioral: Negative.       Today's Vitals   11/12/23 1631 11/12/23 1654  BP: (!) 140/70 (!) 140/64  Pulse: 90   Temp: 98.7 F (37.1 C)   TempSrc: Oral   Weight: 174 lb (78.9 kg)   Height: 5' 4 (1.626 m)   PainSc: 0-No pain    Body mass index is 29.87 kg/m.  Wt Readings from Last 3 Encounters:  11/12/23 174 lb (78.9 kg)  05/15/23 172 lb 3.2 oz (78.1 kg)  03/20/23 174 lb (78.9 kg)     Objective:  Physical Exam Vitals and nursing note reviewed.  Constitutional:      General: She is not in acute distress.    Appearance: Normal appearance. She is obese.  Cardiovascular:     Rate and Rhythm: Normal rate and regular rhythm.     Pulses: Normal pulses.     Heart sounds: Normal heart sounds. No murmur heard. Pulmonary:     Effort: Pulmonary effort is normal. No respiratory distress.     Breath sounds: Normal breath sounds. No wheezing.  Skin:    General: Skin is warm and dry.     Findings: No bruising.     Comments: She has a non mobile mass to left posterior hand non tender.   Neurological:     General: No focal deficit present.     Mental Status: She is alert and oriented to person, place, and time.     Cranial Nerves: No cranial nerve deficit.     Motor: No weakness.  Psychiatric:        Mood and Affect: Mood normal.        Behavior: Behavior normal.        Thought Content: Thought content normal.         Judgment: Judgment normal.     Assessment And Plan:  Essential hypertension Assessment & Plan: In-office blood pressure elevated, possibly due to improper positioning. - Recheck blood pressure during visit which was the same.  - Encouraged regular home blood pressure monitoring. - she is to check her blood pressure daily and send us  the readings in a week  Orders: -     BMP8+eGFR  Hyperlipidemia, unspecified hyperlipidemia type Assessment & Plan: Stable on current medication. - Check cholesterol levels.  Orders: -     Lipid panel  Prediabetes Assessment & Plan: Stable on current medication. - Check hemoglobin A1c and kidney function.  Orders: -  Hemoglobin A1c  Mass of joint of left hand Assessment & Plan: Small, non-painful cyst on left hand, likely benign. - Order ultrasound of left hand after insurance switch to confirm diagnosis   Right shoulder tendinitis Assessment & Plan: Right shoulder pain managed with meloxicam . - Prescribe meloxicam  as needed for pain management.   Other orders -     Meloxicam ; Take 1 tablet (7.5 mg total) by mouth daily.  Dispense: 30 tablet; Refill: 1    Return for keep same next.  Patient was given opportunity to ask questions. Patient verbalized understanding of the plan and was able to repeat key elements of the plan. All questions were answered to their satisfaction.    LILLETTE Gaines Ada, FNP, have reviewed all documentation for this visit. The documentation on 11/19/23 for the exam, diagnosis, procedures, and orders are all accurate and complete.   IF YOU HAVE BEEN REFERRED TO A SPECIALIST, IT MAY TAKE 1-2 WEEKS TO SCHEDULE/PROCESS THE REFERRAL. IF YOU HAVE NOT HEARD FROM US /SPECIALIST IN TWO WEEKS, PLEASE GIVE US  A CALL AT [phone] X [phone].

## 2023-11-13 LAB — LIPID PANEL
Chol/HDL Ratio: 2.3 ratio (ref 0.0–4.4)
Cholesterol, Total: 166 mg/dL (ref 100–199)
HDL: 72 mg/dL (ref >39)
LDL Chol Calc (NIH): 84 mg/dL (ref 0–99)
Triglycerides: 48 mg/dL (ref 0–149)
VLDL Cholesterol Cal: 10 mg/dL (ref 5–40)

## 2023-11-13 LAB — BMP8+EGFR
BUN/Creatinine Ratio: 20 (ref 12–28)
BUN: 19 mg/dL (ref 8–27)
CO2: 22 mmol/L (ref 20–29)
Calcium: 9.7 mg/dL (ref 8.7–10.3)
Chloride: 106 mmol/L (ref 96–106)
Creatinine, Ser: 0.96 mg/dL (ref 0.57–1.00)
Glucose: 132 mg/dL — ABNORMAL HIGH (ref 70–99)
Potassium: 4.1 mmol/L (ref 3.5–5.2)
Sodium: 141 mmol/L (ref 134–144)
eGFR: 66 mL/min/1.73 (ref >59)

## 2023-11-13 LAB — HEMOGLOBIN A1C
Est. average glucose Bld gHb Est-mCnc: 120 mg/dL
Hgb A1c MFr Bld: 5.8 % — ABNORMAL HIGH (ref 4.8–5.6)

## 2023-11-19 ENCOUNTER — Ambulatory Visit: Payer: Self-pay | Admitting: Nurse Practitioner

## 2023-11-20 ENCOUNTER — Other Ambulatory Visit: Payer: Self-pay | Admitting: Nurse Practitioner

## 2023-11-20 DIAGNOSIS — R7303 Prediabetes: Secondary | ICD-10-CM

## 2024-01-06 ENCOUNTER — Other Ambulatory Visit: Payer: Self-pay | Admitting: Nurse Practitioner

## 2024-01-06 DIAGNOSIS — R030 Elevated blood-pressure reading, without diagnosis of hypertension: Secondary | ICD-10-CM

## 2024-01-15 ENCOUNTER — Other Ambulatory Visit: Payer: Self-pay | Admitting: Nurse Practitioner

## 2024-02-09 NOTE — Progress Notes (Signed)
 Pt screened for SDOH needs, HTN. BP taken times two, five minutes apart. Educated pt on risk of HTN and healthy lifestyle. Encouraged to  follow up with PCP

## 2024-02-27 ENCOUNTER — Encounter: Payer: Self-pay | Admitting: Nurse Practitioner

## 2024-02-27 ENCOUNTER — Ambulatory Visit (INDEPENDENT_AMBULATORY_CARE_PROVIDER_SITE_OTHER): Payer: PRIVATE HEALTH INSURANCE | Admitting: Nurse Practitioner

## 2024-02-27 VITALS — BP 120/70 | HR 71 | Temp 99.3°F | Ht 64.0 in | Wt 176.0 lb

## 2024-02-27 DIAGNOSIS — Z23 Encounter for immunization: Secondary | ICD-10-CM | POA: Diagnosis not present

## 2024-02-27 DIAGNOSIS — I10 Essential (primary) hypertension: Secondary | ICD-10-CM | POA: Diagnosis not present

## 2024-02-27 DIAGNOSIS — E559 Vitamin D deficiency, unspecified: Secondary | ICD-10-CM

## 2024-02-27 DIAGNOSIS — Z6830 Body mass index (BMI) 30.0-30.9, adult: Secondary | ICD-10-CM

## 2024-02-27 DIAGNOSIS — R319 Hematuria, unspecified: Secondary | ICD-10-CM

## 2024-02-27 DIAGNOSIS — E782 Mixed hyperlipidemia: Secondary | ICD-10-CM

## 2024-02-27 DIAGNOSIS — Z0000 Encounter for general adult medical examination without abnormal findings: Secondary | ICD-10-CM

## 2024-02-27 DIAGNOSIS — E6609 Other obesity due to excess calories: Secondary | ICD-10-CM

## 2024-02-27 DIAGNOSIS — Z1231 Encounter for screening mammogram for malignant neoplasm of breast: Secondary | ICD-10-CM

## 2024-02-27 DIAGNOSIS — Z79899 Other long term (current) drug therapy: Secondary | ICD-10-CM

## 2024-02-27 DIAGNOSIS — R7303 Prediabetes: Secondary | ICD-10-CM | POA: Diagnosis not present

## 2024-02-27 DIAGNOSIS — H9190 Unspecified hearing loss, unspecified ear: Secondary | ICD-10-CM

## 2024-02-27 DIAGNOSIS — E66811 Obesity, class 1: Secondary | ICD-10-CM

## 2024-02-27 DIAGNOSIS — M79672 Pain in left foot: Secondary | ICD-10-CM

## 2024-02-27 DIAGNOSIS — E2839 Other primary ovarian failure: Secondary | ICD-10-CM

## 2024-02-27 LAB — POCT URINALYSIS DIP (CLINITEK)
Bilirubin, UA: NEGATIVE
Glucose, UA: NEGATIVE mg/dL
Ketones, POC UA: NEGATIVE mg/dL
Leukocytes, UA: NEGATIVE
Nitrite, UA: NEGATIVE
POC PROTEIN,UA: NEGATIVE
Spec Grav, UA: >=1.03 — AB (ref 1.010–1.025)
Urobilinogen, UA: 0.2 U/dL
pH, UA: 5.5 (ref 5.0–8.0)

## 2024-02-27 NOTE — Progress Notes (Signed)
 Subjective:   Lori Mcdaniel is a 68 y.o. female who presents for a Welcome to [facility] Exam.   Discussed the use of AI scribe software for clinical note transcription with the patient, who gave verbal consent to proceed.  History of Present Illness Lori Mcdaniel is a 68 year old female who presents for a Medicare wellness visit.  She works full-time as a runner, broadcasting/film/video and does not have insurance through her job. She previously encountered issues obtaining a bone density test due to network problems with her insurance.  She has no history of falls and reports a memory score of four. She failed her hearing test at 4000 hertz but does not perceive any hearing problems, attributing it to working in a loud environment. She mentions hearing the beeps during the test but not the thumps.  Her physical activity includes bowling twice a week, but she acknowledges limited exercise otherwise.  Regarding her diet, she finds it challenging to maintain due to rising costs and her work schedule. She tries to eat breakfast to manage her low morning blood sugar, which she reports as being around 90-100.  She reports heel pain, possibly due to a heel spur, which hurts when she steps down but not while walking. She attributes this to changes in her feet or her steel-toe shoes.  She has a history of a throat nodule, which she feels has become more noticeable, although it does not affect her swallowing. She has not had any recent issues with swallowing and was told by her throat doctor last year that she did not need to return immediately.   Allergies (verified) Penicillins   History: Past Medical History:  Diagnosis Date   Diabetes mellitus without complication (HCC)    GERD (gastroesophageal reflux disease)    years ago   New daily persistent headache 03/07/2023   Thyroid  disease    Thyroid  mass of unclear etiology    Past Surgical History:  Procedure Laterality Date   ABDOMINAL HYSTERECTOMY      BREAST CYST EXCISION Left 1973   CESAREAN SECTION     COLONOSCOPY     KELOID EXCISION Bilateral    ears   THYROIDECTOMY Right 06/27/2017   THYROIDECTOMY Right 06/27/2017   Procedure: THYROIDECTOMY;  Surgeon: Jesus Oliphant, MD;  Location: [facility] OR;  Service: ENT;  Laterality: Right;  Right Thyroid  lobectomy, frozen section possible total   Family History  Problem Relation Age of Onset   Diabetes Mother    Alzheimer's disease Mother    Hypertension Mother    Alzheimer's disease Father    Stroke Father    Breast cancer Neg Hx    Social History   Occupational History   Not on file  Tobacco Use   Smoking status: Never   Smokeless tobacco: Never  Vaping Use   Vaping status: Never Used  Substance and Sexual Activity   Alcohol use: No   Drug use: No   Sexual activity: Not Currently    Birth control/protection: Abstinence   Tobacco Counseling Counseling given: Not Answered  SDOH Screenings   Food Insecurity: No Food Insecurity (02/27/2024)  Recent Concern: Food Insecurity - Food Insecurity Present (02/09/2024)  Housing: Low Risk  (02/27/2024)  Transportation Needs: No Transportation Needs (02/27/2024)  Utilities: Not At Risk (02/27/2024)  Alcohol Screen: Low Risk  (02/27/2024)  Depression (PHQ2-9): Low Risk  (02/27/2024)  Financial Resource Strain: Low Risk  (02/27/2024)  Physical Activity: Sufficiently Active (02/27/2024)  Social Connections: Moderately Isolated (02/27/2024)  Stress:  No Stress Concern Present (02/27/2024)  Tobacco Use: Low Risk  (02/27/2024)  Health Literacy: Adequate Health Literacy (02/27/2024)   Depression Screen    02/27/2024    2:54 PM 05/15/2023    4:00 PM 02/26/2023    3:03 PM 02/20/2022    3:10 PM 02/14/2021    4:36 PM 08/06/2019    3:42 PM 02/05/2019    2:27 PM  PHQ 2/9 Scores  PHQ - 2 Score 0 0 0 0 0 0 0  PHQ- 9 Score  0 0         Goals Addressed               This Visit's Progress     Patient Stated (pt-stated)        Finding a hobby.         Visit info / Clinical Intake: Interpreter Needed?: No  Fall Screening Falls in the past year?: 0 Number of falls in past year: 0 Was there an injury with Fall?: 0 Fall Risk Category Calculator: 0 Patient Fall Risk Level: Low Fall Risk  Fall Risk Patient at Risk for Falls Due to: No Fall Risks Fall risk Follow up: Falls evaluation completed  Cognitive Assessment What year is it?: 0 points What month is it?: 0 points Give patient an address phrase to remember (5 components): [address] [place]. About what time is it?: 0 points Count backwards from 20 to 1: 0 points Say the months of the year in reverse: 0 points Repeat the address phrase from earlier: 4 points 6 CIT Score: 4 points         Objective:    Today's Vitals   02/27/24 1458  BP: 120/70  Pulse: 71  Temp: 99.3 F (37.4 C)  TempSrc: Oral  Weight: 176 lb (79.8 kg)  Height: 5' 4 (1.626 m)  PainSc: 0-No pain   Body mass index is 30.21 kg/m.   Wt Readings from Last 3 Encounters:  02/27/24 176 lb (79.8 kg)  11/12/23 174 lb (78.9 kg)  05/15/23 172 lb 3.2 oz (78.1 kg)    Physical Exam Vitals and nursing note reviewed.  Constitutional:      General: She is not in acute distress.    Appearance: Normal appearance. She is well-developed.  HENT:     Head: Normocephalic and atraumatic.     Right Ear: Hearing, tympanic membrane, ear canal and external ear normal. There is no impacted cerumen.     Left Ear: Hearing, tympanic membrane, ear canal and external ear normal. There is no impacted cerumen.     Nose: Nose normal.     Mouth/Throat:     Mouth: Mucous membranes are moist.  Eyes:     General: Lids are normal.     Extraocular Movements: Extraocular movements intact.     Conjunctiva/sclera: Conjunctivae normal.     Pupils: Pupils are equal, round, and reactive to light.     Funduscopic exam:    Right eye: No papilledema.        Left eye: No papilledema.  Neck:     Thyroid : No thyroid  mass or  thyromegaly.     Vascular: No carotid bruit.     Trachea: Trachea normal.  Cardiovascular:     Rate and Rhythm: Normal rate and regular rhythm.     Pulses: Normal pulses.     Heart sounds: Normal heart sounds. No murmur heard. Pulmonary:     Effort: Pulmonary effort is normal. No respiratory distress.  Breath sounds: Normal breath sounds. No wheezing.  Chest:     Chest wall: No mass.  Breasts:    Tanner Score is 5.     Right: Normal. No mass or tenderness.     Left: Normal. No mass or tenderness.  Abdominal:     General: Abdomen is flat. Bowel sounds are normal. There is no distension.     Palpations: Abdomen is soft.     Tenderness: There is no abdominal tenderness.  Genitourinary:    Rectum: Guaiac result negative.  Musculoskeletal:        General: No swelling or tenderness. Normal range of motion.     Cervical back: Full passive range of motion without pain, normal range of motion and neck supple.     Right lower leg: No edema.     Left lower leg: No edema.  Lymphadenopathy:     Upper Body:     Right upper body: No supraclavicular, axillary or pectoral adenopathy.     Left upper body: No supraclavicular, axillary or pectoral adenopathy.  Skin:    General: Skin is warm and dry.     Capillary Refill: Capillary refill takes less than 2 seconds.  Neurological:     General: No focal deficit present.     Mental Status: She is alert and oriented to person, place, and time.     Cranial Nerves: No cranial nerve deficit.     Sensory: No sensory deficit.  Psychiatric:        Mood and Affect: Mood normal.        Behavior: Behavior normal.        Thought Content: Thought content normal.        Judgment: Judgment normal.      Current Medications (verified) Outpatient Encounter Medications as of 02/27/2024  Medication Sig   amLODipine  (NORVASC ) 2.5 MG tablet TAKE 1 TABLET BY MOUTH DAILY   atorvastatin  (LIPITOR) 10 MG tablet TAKE 1 TABLET BY MOUTH DAILY   meloxicam  (MOBIC )  7.5 MG tablet TAKE 1 TABLET(7.5 MG) BY MOUTH DAILY   metFORMIN  (GLUCOPHAGE ) 500 MG tablet TAKE 1 TABLET BY MOUTH TWICE  DAILY WITH MEALS   No facility-administered encounter medications on file as of 02/27/2024.   Hearing/Vision screen Hearing Screening   500Hz  1000Hz  2000Hz  4000Hz   Right ear 20 25 40 Fail  Left ear 25 20 20  Fail   Immunizations and Health Maintenance Health Maintenance  Topic Date Due   DEXA SCAN  Never done   Mammogram  12/21/2023   COVID-19 Vaccine (3 - Pfizer risk series) 03/14/2024 (Originally 10/10/2019)   Medicare Annual Wellness (AWV)  02/26/2025   Fecal DNA (Cologuard)  02/27/2025   DTaP/Tdap/Td (2 - Td or Tdap) 07/18/2025   Pneumococcal Vaccine: 50+ Years  Completed   Influenza Vaccine  Completed   Hepatitis C Screening  Completed   HIV Screening  Completed   Zoster Vaccines- Shingrix   Completed   Hepatitis B Vaccines 19-59 Average Risk  Aged Out   Meningococcal B Vaccine  Aged Out   Colonoscopy  Discontinued    EKG: normal EKG, normal sinus rhythm, unchanged from previous tracings     Assessment/Plan:  This is a routine wellness examination for [facility]. Assessment and Plan Assessment & Plan Adult Wellness Visit Routine wellness visit with stable blood pressure and weight. Mild memory concerns noted. Thyroid  mass unchanged but more noticeable. - Ordered mammogram and bone density test. - Scheduled vision test. - Scheduled annual physical exam.  Essential hypertension  Blood pressure well-controlled at 120/70 mmHg.  Prediabetes Low morning blood sugar levels reported. Difficulty maintaining healthy diet due to work and financial constraints.  Mixed hyperlipidemia Prescribed Amlodipine  (norvasc ) 2.5 MG oral daily and Atorvastatin  (lipitor) 10 MG oral daily.  Obesity, class 1 Weight at 176 lbs. Limited physical activity, primarily bowling twice a week. - Encouraged increased physical activity.  Pain in left foot (heel spur) Pain when stepping  down, likely due to heel spur. No pain during walking.  Unspecified hearing loss Failed hearing test at 4000 Hz. No significant daily hearing issues reported. - Referred to audiologist for further evaluation.  Patient Care Team: Georgina Speaks, FNP as PCP - General (General Practice)  I have personally reviewed and noted the following in the patient's chart:   Medical and social history Use of alcohol, tobacco or illicit drugs  Current medications and supplements including opioid prescriptions. Functional ability and status Nutritional status Physical activity Advanced directives List of other physicians Hospitalizations, surgeries, and ER visits in previous 12 months Vitals Screenings to include cognitive, depression, and falls Referrals and appointments  Orders Placed This Encounter  Procedures   Culture, Urine   DG Bone Density    Standing Status:   Future    Expiration Date:   02/26/2025    Reason for Exam (SYMPTOM  OR DIAGNOSIS REQUIRED):   decreased estrogen    Preferred imaging location?:   External   MM 3D SCREENING MAMMOGRAM BILATERAL BREAST    [employer] PF; EPIC ORDER    Standing Status:   Future    Expiration Date:   02/26/2025    Reason for Exam (SYMPTOM  OR DIAGNOSIS REQUIRED):   screening    Preferred imaging location?:   [facility]   Flu vaccine HIGH DOSE PF(Fluzone Trivalent)   Microalbumin / creatinine urine ratio   CBC with Differential/Platelet   CMP14+EGFR   Hemoglobin A1c   Lipid panel   VITAMIN D  25 Hydroxy (Vit-D Deficiency, Fractures)   TSH   Ambulatory referral to Audiology    Referral Priority:   Routine    Referral Type:   Audiology Exam    Referral Reason:   Specialty Services Required    Number of Visits Requested:   1   POCT URINALYSIS DIP (CLINITEK)   EKG 12-Lead   In addition, I have reviewed and discussed with patient certain preventive protocols, quality metrics, and best practice recommendations. A written personalized care plan  for preventive services as well as general preventive health recommendations were provided to patient.   Speaks Georgina, FNP   02/27/2024   Return in 1 year (on 02/26/2025) for 6 month phy check, 1 yr [facility]-AWV.

## 2024-02-27 NOTE — Progress Notes (Deleted)
 Lori Mcdaniel, CMA,acting as a neurosurgeon for Gaines Ada, FNP.,have documented all relevant documentation on the behalf of Gaines Ada, FNP,as directed by  Gaines Ada, FNP while in the presence of Gaines Ada, FNP.  Subjective:    Patient ID: Lori Mcdaniel , female    DOB: 08/05/1955 , 68 y.o.   MRN: [id number]  No chief complaint on file.   HPI  HPI   Past Medical History:  Diagnosis Date   GERD (gastroesophageal reflux disease)    years ago   New daily persistent headache 03/07/2023   Thyroid  mass of unclear etiology      Family History  Problem Relation Age of Onset   Diabetes Mother    Alzheimer's disease Mother    Alzheimer's disease Father    Stroke Father    Breast cancer Neg Hx      Current Outpatient Medications:    amLODipine  (NORVASC ) 2.5 MG tablet, TAKE 1 TABLET BY MOUTH DAILY, Disp: 90 tablet, Rfl: 3   atorvastatin  (LIPITOR) 10 MG tablet, TAKE 1 TABLET BY MOUTH DAILY, Disp: 90 tablet, Rfl: 3   meloxicam  (MOBIC ) 7.5 MG tablet, TAKE 1 TABLET(7.5 MG) BY MOUTH DAILY, Disp: 30 tablet, Rfl: 1   metFORMIN  (GLUCOPHAGE ) 500 MG tablet, TAKE 1 TABLET BY MOUTH TWICE  DAILY WITH MEALS, Disp: 180 tablet, Rfl: 3   Allergies  Allergen Reactions   Penicillins Nausea And Vomiting    UNSPECIFIED REACTION  Pt has had med in last 10 yrs - was ok to take Has patient had a PCN reaction causing immediate rash, facial/tongue/throat swelling, SOB or lightheadedness with hypotension: no Has patient had a PCN reaction causing severe rash involving mucus membranes or skin necrosis: no Has patient had a PCN reaction that required hospitalization: no Has patient had a PCN reaction occurring within the last 10 years: no If all of the above answers are NO, then may proceed with Cephalos      The patient states she uses {contraceptive methods:5051} for birth control. No LMP recorded. Patient is postmenopausal.. {Dysmenorrhea-menorrhagia:21918}. Negative for: breast discharge,  breast lump(s), breast pain and breast self exam. Associated symptoms include abnormal vaginal bleeding. Pertinent negatives include abnormal bleeding (hematology), anxiety, decreased libido, depression, difficulty falling sleep, dyspareunia, history of infertility, nocturia, sexual dysfunction, sleep disturbances, urinary incontinence, urinary urgency, vaginal discharge and vaginal itching. Diet regular.The patient states her exercise level is    . The patient's tobacco use is:  Social History   Tobacco Use  Smoking Status Never  Smokeless Tobacco Never  . She has been exposed to passive smoke. The patient's alcohol use is:  Social History   Substance and Sexual Activity  Alcohol Use No  . Additional information: Last pap ***, next one scheduled for ***.    Review of Systems   There were no vitals filed for this visit. There is no height or weight on file to calculate BMI.  Wt Readings from Last 3 Encounters:  11/12/23 174 lb (78.9 kg)  05/15/23 172 lb 3.2 oz (78.1 kg)  03/20/23 174 lb (78.9 kg)     Objective:  Physical Exam      Assessment And Plan:     Encounter for annual health examination  Essential hypertension  Prediabetes  Hyperlipidemia, unspecified hyperlipidemia type  Vitamin D  deficiency     No follow-ups on file. Patient was given opportunity to ask questions. Patient verbalized understanding of the plan and was able to repeat key elements of the plan. All questions  were answered to their satisfaction.   Gaines Ada, FNP  I, Gaines Ada, FNP, have reviewed all documentation for this visit. The documentation on 02/27/24 for the exam, diagnosis, procedures, and orders are all accurate and complete.

## 2024-02-28 LAB — CMP14+EGFR
ALT: 17 IU/L (ref 0–32)
AST: 20 IU/L (ref 0–40)
Albumin: 4.3 g/dL (ref 3.9–4.9)
Alkaline Phosphatase: 114 IU/L (ref 49–135)
BUN/Creatinine Ratio: 19 (ref 12–28)
BUN: 16 mg/dL (ref 8–27)
Bilirubin Total: 0.4 mg/dL (ref 0.0–1.2)
CO2: 23 mmol/L (ref 20–29)
Calcium: 9.9 mg/dL (ref 8.7–10.3)
Chloride: 102 mmol/L (ref 96–106)
Creatinine, Ser: 0.85 mg/dL (ref 0.57–1.00)
Globulin, Total: 2.6 g/dL (ref 1.5–4.5)
Glucose: 83 mg/dL (ref 70–99)
Potassium: 4.2 mmol/L (ref 3.5–5.2)
Sodium: 141 mmol/L (ref 134–144)
Total Protein: 6.9 g/dL (ref 6.0–8.5)
eGFR: 76 mL/min/1.73 (ref >59)

## 2024-02-28 LAB — TSH: TSH: 1.43 u[IU]/mL (ref 0.450–4.500)

## 2024-02-28 LAB — CBC WITH DIFFERENTIAL/PLATELET
Basophils Absolute: 0 x10E3/uL (ref 0.0–0.2)
Basos: 0 %
EOS (ABSOLUTE): 0.1 x10E3/uL (ref 0.0–0.4)
Eos: 1 %
Hematocrit: 39.7 % (ref 34.0–46.6)
Hemoglobin: 13 g/dL (ref 11.1–15.9)
Immature Grans (Abs): 0 x10E3/uL (ref 0.0–0.1)
Immature Granulocytes: 0 %
Lymphocytes Absolute: 1.9 x10E3/uL (ref 0.7–3.1)
Lymphs: 33 %
MCH: 29.5 pg (ref 26.6–33.0)
MCHC: 32.7 g/dL (ref 31.5–35.7)
MCV: 90 fL (ref 79–97)
Monocytes Absolute: 0.4 x10E3/uL (ref 0.1–0.9)
Monocytes: 8 %
Neutrophils Absolute: 3.3 x10E3/uL (ref 1.4–7.0)
Neutrophils: 58 %
Platelets: 252 x10E3/uL (ref 150–450)
RBC: 4.41 x10E6/uL (ref 3.77–5.28)
RDW: 13 % (ref 11.7–15.4)
WBC: 5.8 x10E3/uL (ref 3.4–10.8)

## 2024-02-28 LAB — MICROALBUMIN / CREATININE URINE RATIO
Creatinine, Urine: 146.2 mg/dL
Microalb/Creat Ratio: <2 mg/g{creat} (ref 0–29)
Microalbumin, Urine: <3 ug/mL

## 2024-02-28 LAB — LIPID PANEL
Chol/HDL Ratio: 2.3 ratio (ref 0.0–4.4)
Cholesterol, Total: 173 mg/dL (ref 100–199)
HDL: 74 mg/dL (ref >39)
LDL Chol Calc (NIH): 90 mg/dL (ref 0–99)
Triglycerides: 42 mg/dL (ref 0–149)
VLDL Cholesterol Cal: 9 mg/dL (ref 5–40)

## 2024-02-28 LAB — HEMOGLOBIN A1C
Est. average glucose Bld gHb Est-mCnc: 117 mg/dL
Hgb A1c MFr Bld: 5.7 % — ABNORMAL HIGH (ref 4.8–5.6)

## 2024-02-28 LAB — VITAMIN D 25 HYDROXY (VIT D DEFICIENCY, FRACTURES): Vit D, 25-Hydroxy: 34.4 ng/mL (ref 30.0–100.0)

## 2024-02-29 LAB — URINE CULTURE

## 2024-03-06 DIAGNOSIS — M79672 Pain in left foot: Secondary | ICD-10-CM | POA: Insufficient documentation

## 2024-03-06 DIAGNOSIS — Z1231 Encounter for screening mammogram for malignant neoplasm of breast: Secondary | ICD-10-CM | POA: Insufficient documentation

## 2024-03-06 DIAGNOSIS — R319 Hematuria, unspecified: Secondary | ICD-10-CM | POA: Insufficient documentation

## 2024-03-06 DIAGNOSIS — E66811 Obesity, class 1: Secondary | ICD-10-CM | POA: Insufficient documentation

## 2024-03-06 DIAGNOSIS — H9190 Unspecified hearing loss, unspecified ear: Secondary | ICD-10-CM | POA: Insufficient documentation

## 2024-03-06 DIAGNOSIS — Z0000 Encounter for general adult medical examination without abnormal findings: Secondary | ICD-10-CM | POA: Insufficient documentation

## 2024-03-06 NOTE — Assessment & Plan Note (Signed)
 Routine wellness visit with stable blood pressure and weight. Mild memory concerns noted. Thyroid  mass unchanged but more noticeable. - Ordered mammogram and bone density test. - Scheduled vision test. - Scheduled annual physical exam.

## 2024-03-09 ENCOUNTER — Ambulatory Visit: Payer: Self-pay | Admitting: Nurse Practitioner

## 2024-03-31 ENCOUNTER — Ambulatory Visit

## 2024-04-15 ENCOUNTER — Ambulatory Visit
Admission: RE | Admit: 2024-04-15 | Discharge: 2024-04-15 | Disposition: A | Discharge status: Home / Self Care | Source: Ambulatory Visit | Attending: Nurse Practitioner | Admitting: Nurse Practitioner

## 2024-04-15 DIAGNOSIS — Z1231 Encounter for screening mammogram for malignant neoplasm of breast: Secondary | ICD-10-CM

## 2024-08-26 ENCOUNTER — Encounter: Payer: Self-pay | Admitting: Nurse Practitioner

## 2025-03-02 ENCOUNTER — Encounter: Admitting: Nurse Practitioner

## 2025-03-25 ENCOUNTER — Ambulatory Visit
